# Patient Record
Sex: Male | Born: 1980 | Race: Black or African American | Hispanic: No | Marital: Single | Smoking: Never smoker
Health system: Southern US, Community
[De-identification: ages and names within clinical notes are randomized; demographics above are authoritative.]

## PROBLEM LIST (undated history)

## (undated) DIAGNOSIS — T7840XA Allergy, unspecified, initial encounter: Secondary | ICD-10-CM

## (undated) HISTORY — DX: Allergy, unspecified, initial encounter: T78.40XA

---

## 2003-11-19 ENCOUNTER — Emergency Department (HOSPITAL_COMMUNITY): Admission: EM | Admit: 2003-11-19 | Discharge: 2003-11-19 | Payer: Self-pay | Admitting: Emergency Medicine

## 2003-12-10 ENCOUNTER — Emergency Department (HOSPITAL_COMMUNITY): Admission: EM | Admit: 2003-12-10 | Discharge: 2003-12-11 | Payer: Self-pay | Admitting: Emergency Medicine

## 2003-12-11 ENCOUNTER — Ambulatory Visit (HOSPITAL_COMMUNITY): Admission: RE | Admit: 2003-12-11 | Discharge: 2003-12-11 | Payer: Self-pay | Admitting: Emergency Medicine

## 2004-10-05 ENCOUNTER — Emergency Department (HOSPITAL_COMMUNITY): Admission: EM | Admit: 2004-10-05 | Discharge: 2004-10-05 | Payer: Self-pay | Admitting: *Deleted

## 2004-12-06 ENCOUNTER — Emergency Department (HOSPITAL_COMMUNITY): Admission: EM | Admit: 2004-12-06 | Discharge: 2004-12-06 | Payer: Self-pay | Admitting: Family Medicine

## 2010-03-29 ENCOUNTER — Emergency Department (HOSPITAL_COMMUNITY): Admission: EM | Admit: 2010-03-29 | Discharge: 2009-08-05 | Payer: Self-pay | Admitting: Emergency Medicine

## 2016-12-16 DIAGNOSIS — M542 Cervicalgia: Secondary | ICD-10-CM | POA: Diagnosis not present

## 2016-12-16 DIAGNOSIS — M9901 Segmental and somatic dysfunction of cervical region: Secondary | ICD-10-CM | POA: Diagnosis not present

## 2016-12-16 DIAGNOSIS — M9902 Segmental and somatic dysfunction of thoracic region: Secondary | ICD-10-CM | POA: Diagnosis not present

## 2016-12-16 DIAGNOSIS — M436 Torticollis: Secondary | ICD-10-CM | POA: Diagnosis not present

## 2016-12-17 DIAGNOSIS — M542 Cervicalgia: Secondary | ICD-10-CM | POA: Diagnosis not present

## 2016-12-17 DIAGNOSIS — M9902 Segmental and somatic dysfunction of thoracic region: Secondary | ICD-10-CM | POA: Diagnosis not present

## 2016-12-17 DIAGNOSIS — M436 Torticollis: Secondary | ICD-10-CM | POA: Diagnosis not present

## 2016-12-17 DIAGNOSIS — M9901 Segmental and somatic dysfunction of cervical region: Secondary | ICD-10-CM | POA: Diagnosis not present

## 2017-10-02 ENCOUNTER — Other Ambulatory Visit: Payer: Self-pay

## 2017-10-02 ENCOUNTER — Emergency Department (HOSPITAL_COMMUNITY)
Admission: EM | Admit: 2017-10-02 | Discharge: 2017-10-02 | Disposition: A | Discharge status: Home / Self Care | Payer: BLUE CROSS/BLUE SHIELD | Attending: Emergency Medicine | Admitting: Emergency Medicine

## 2017-10-02 ENCOUNTER — Encounter (HOSPITAL_COMMUNITY): Payer: Self-pay | Admitting: Emergency Medicine

## 2017-10-02 DIAGNOSIS — S71112A Laceration without foreign body, left thigh, initial encounter: Secondary | ICD-10-CM | POA: Insufficient documentation

## 2017-10-02 DIAGNOSIS — Y999 Unspecified external cause status: Secondary | ICD-10-CM | POA: Diagnosis not present

## 2017-10-02 DIAGNOSIS — Y929 Unspecified place or not applicable: Secondary | ICD-10-CM | POA: Diagnosis not present

## 2017-10-02 DIAGNOSIS — Z23 Encounter for immunization: Secondary | ICD-10-CM | POA: Insufficient documentation

## 2017-10-02 DIAGNOSIS — W268XXA Contact with other sharp object(s), not elsewhere classified, initial encounter: Secondary | ICD-10-CM | POA: Insufficient documentation

## 2017-10-02 DIAGNOSIS — S71111A Laceration without foreign body, right thigh, initial encounter: Secondary | ICD-10-CM

## 2017-10-02 DIAGNOSIS — Y93E8 Activity, other personal hygiene: Secondary | ICD-10-CM | POA: Insufficient documentation

## 2017-10-02 MED ORDER — CEPHALEXIN 500 MG PO CAPS: 500.0000 mg | ORAL_CAPSULE | Freq: Two times a day (BID) | ORAL | 0 refills | Status: DC

## 2017-10-02 MED ORDER — TRAMADOL HCL 50 MG PO TABS: 50.0000 mg | ORAL_TABLET | Freq: Four times a day (QID) | ORAL | 0 refills | Status: DC | PRN

## 2017-10-02 MED ORDER — LIDOCAINE-EPINEPHRINE 2 %-1:100000 IJ SOLN
20.0000 mL | Freq: Once | INTRAMUSCULAR | Status: AC
  Administered 2017-10-02: 20 mL
  Filled 2017-10-02 (×2): qty 20

## 2017-10-02 MED ORDER — TETANUS-DIPHTH-ACELL PERTUSSIS 5-2.5-18.5 LF-MCG/0.5 IM SUSP
0.5000 mL | Freq: Once | INTRAMUSCULAR | Status: AC
  Administered 2017-10-02: 0.5 mL via INTRAMUSCULAR
  Filled 2017-10-02: qty 0.5

## 2017-10-02 MED ORDER — CEPHALEXIN 250 MG PO CAPS
500.0000 mg | ORAL_CAPSULE | Freq: Once | ORAL | Status: AC
  Administered 2017-10-02: 500 mg via ORAL
  Filled 2017-10-02: qty 2

## 2017-10-02 NOTE — ED Triage Notes (Signed)
Pt cut his right thigh with a razor blade while on the job. Scant amount of bleeding noted to wound. Unknown last tetanus.

## 2017-10-02 NOTE — ED Provider Notes (Signed)
[facility] Provider Note   CSN: [id number] Arrival date & time: 10/02/17  1957   History   Chief Complaint Chief Complaint  Patient presents with  . Laceration    HPI Rigoberto NoelJason J Caughlin is a 37 y.o. male.  HPI   [age] YOM presents after lacerating his right thigh.  Patient notes he was using a razor cut through the anterior aspect of the thigh.  He denies any loss of distal sensation strength and motor function full extension at the knee.  Bleeding controlled with direct compression.  Unknown last tetanus shot.  History reviewed. No pertinent past medical history.  There are no active problems to display for this patient.        Home Medications    Prior to Admission medications   Medication Sig Start Date End Date Taking? Authorizing Provider  cephALEXin (KEFLEX) 500 MG capsule Take 1 capsule (500 mg total) by mouth 2 (two) times daily. 10/02/17   Elnora Quizon, Tinnie GensJeffrey, PA-C  traMADol (ULTRAM) 50 MG tablet Take 1 tablet (50 mg total) by mouth every 6 (six) hours as needed. 10/02/17   Eyvonne MechanicHedges, [employer]i [employer], PA-C    Family History No family history on file.  Social History Social History   Tobacco Use  . Smoking status: Not on file  Substance Use Topics  . Alcohol use: Not on file  . Drug use: Not on file     Allergies   Patient has no known allergies.   Review of Systems Review of Systems  All other systems reviewed and are negative.    Physical Exam Updated Vital Signs BP 103/75   Pulse 81   Temp 98.8 F (37.1 C) (Oral)   Resp 16   Ht 6' (1.829 m)   Wt 108.9 kg (240 lb)   SpO2 99%   BMI 32.55 kg/m   Physical Exam  Constitutional: He is oriented to person, place, and time. He appears well-developed and well-nourished.  HENT:  Head: Normocephalic and atraumatic.  Eyes: Pupils are equal, round, and reactive to light. Conjunctivae are normal. Right eye exhibits no discharge. Left eye exhibits no discharge. No scleral  icterus.  Neck: Normal range of motion. No JVD present. No tracheal deviation present.  Pulmonary/Chest: Effort normal. No stridor.  Musculoskeletal:  7 cm laceration right anterior distal thigh -no muscular or tendon involvement distal sensation intact, strength intact with full extension at the knee  Neurological: He is alert and oriented to person, place, and time. Coordination normal.  Psychiatric: He has a normal mood and affect. His behavior is normal. Judgment and thought content normal.  Nursing note and vitals reviewed.    ED Treatments / Results  Labs (all labs ordered are listed, but only abnormal results are displayed) Labs Reviewed - No data to display  EKG None  Radiology No results found.  Procedures .Marland Kitchen.Laceration Repair Date/Time: 10/02/2017 9:48 PM Performed by: Eyvonne MechanicHedges, Genesee Nase, PA-C Authorized by: Eyvonne MechanicHedges, Haris Baack, PA-C   Consent:    Consent obtained:  Verbal   Consent given by:  Patient   Risks discussed:  Pain and poor wound healing Anesthesia (see MAR for exact dosages):    Anesthesia method:  Local infiltration   Local anesthetic:  Lidocaine 2% WITH epi Laceration details:    Location: right thigh.   Length (cm):  7 Repair type:    Repair type:  Simple Pre-procedure details:    Preparation:  Patient was prepped and draped in usual sterile fashion Exploration:  Wound exploration: wound explored through full range of motion and entire depth of wound probed and visualized     Wound extent: no fascia violation noted, no foreign bodies/material noted, no muscle damage noted, no nerve damage noted, no tendon damage noted, no underlying fracture noted and no vascular damage noted     Contaminated: no   Treatment:    Area cleansed with:  Betadine and saline   Amount of cleaning:  Extensive   Irrigation solution:  Sterile saline   Irrigation volume:  1   Irrigation method:  Syringe Skin repair:    Repair method:  Sutures   Suture size:  3-0   Suture  material:  Prolene   Suture technique:  Simple interrupted   Number of sutures:  7 Approximation:    Approximation:  Close Post-procedure details:    Dressing:  Antibiotic ointment and non-adherent dressing Comments:     Additional 3 subcutaneous Vicryl rapides placed    (including critical care time)  Medications Ordered in ED Medications  lidocaine-EPINEPHrine (XYLOCAINE W/EPI) 2 %-1:100000 (with pres) injection 20 mL (20 mLs Infiltration Given 10/02/17 2041)  Tdap (BOOSTRIX) injection 0.5 mL (0.5 mLs Intramuscular Given 10/02/17 2125)  cephALEXin (KEFLEX) capsule 500 mg (500 mg Oral Given 10/02/17 2206)     Initial Impression / Assessment and Plan / ED Course  I have reviewed the triage vital signs and the nursing notes.  Pertinent labs & imaging results that were available during my care of the patient were reviewed by me and considered in my medical decision making (see chart for details).     Labs:   Imaging:  Consults:  Therapeutics: TDAP  Discharge Meds:   Assessment/Plan: 37 year old male presents today with laceration.  Wound copiously irrigated.  This was repaired without complication.  Patient placed on prophylactic antibiotics, he is given strict return precautions, wound care instructions and suture removal instructions.  Patient verbalized understanding and agreement to today's plan had no further questions or concerns at the time discharge.   Final Clinical Impressions(s) / ED Diagnoses   Final diagnoses:  Laceration of right thigh, initial encounter    ED Discharge Orders        Ordered    cephALEXin (KEFLEX) 500 MG capsule  2 times daily     10/02/17 2152    traMADol (ULTRAM) 50 MG tablet  Every 6 hours PRN     10/02/17 2205       Eyvonne Mechanic, PA-C 10/02/17 2220    Margarita Grizzle, MD 10/11/17 1625

## 2017-10-02 NOTE — Discharge Instructions (Addendum)
Please read attached information. If you experience any new or worsening signs or symptoms please return to the emergency room for evaluation. Please follow-up with your primary care provider or specialist as discussed. Please use medication prescribed only as directed and discontinue taking if you have any concerning signs or symptoms.   °

## 2017-12-03 DIAGNOSIS — J069 Acute upper respiratory infection, unspecified: Secondary | ICD-10-CM | POA: Diagnosis not present

## 2017-12-03 DIAGNOSIS — K120 Recurrent oral aphthae: Secondary | ICD-10-CM | POA: Diagnosis not present

## 2018-03-25 DIAGNOSIS — R0982 Postnasal drip: Secondary | ICD-10-CM | POA: Diagnosis not present

## 2018-03-25 DIAGNOSIS — K120 Recurrent oral aphthae: Secondary | ICD-10-CM | POA: Diagnosis not present

## 2018-03-25 DIAGNOSIS — R0981 Nasal congestion: Secondary | ICD-10-CM | POA: Diagnosis not present

## 2018-04-03 ENCOUNTER — Ambulatory Visit
Admission: RE | Admit: 2018-04-03 | Discharge: 2018-04-03 | Disposition: A | Discharge status: Home / Self Care | Payer: BLUE CROSS/BLUE SHIELD | Source: Ambulatory Visit | Attending: Family Medicine | Admitting: Family Medicine

## 2018-04-03 ENCOUNTER — Other Ambulatory Visit: Payer: Self-pay | Admitting: Family Medicine

## 2018-04-03 DIAGNOSIS — R51 Headache: Secondary | ICD-10-CM | POA: Diagnosis not present

## 2018-04-03 DIAGNOSIS — R0981 Nasal congestion: Secondary | ICD-10-CM

## 2018-05-07 DIAGNOSIS — M25512 Pain in left shoulder: Secondary | ICD-10-CM | POA: Diagnosis not present

## 2018-05-07 DIAGNOSIS — M7542 Impingement syndrome of left shoulder: Secondary | ICD-10-CM | POA: Diagnosis not present

## 2018-05-19 ENCOUNTER — Ambulatory Visit: Payer: BLUE CROSS/BLUE SHIELD | Admitting: Allergy & Immunology

## 2018-05-19 ENCOUNTER — Encounter: Payer: Self-pay | Admitting: Allergy & Immunology

## 2018-05-19 VITALS — BP 110/66 | HR 66 | Temp 98.6°F | Resp 16 | Ht 72.0 in | Wt 240.6 lb

## 2018-05-19 DIAGNOSIS — J302 Other seasonal allergic rhinitis: Secondary | ICD-10-CM | POA: Diagnosis not present

## 2018-05-19 DIAGNOSIS — E739 Lactose intolerance, unspecified: Secondary | ICD-10-CM | POA: Insufficient documentation

## 2018-05-19 DIAGNOSIS — T781XXD Other adverse food reactions, not elsewhere classified, subsequent encounter: Secondary | ICD-10-CM

## 2018-05-19 DIAGNOSIS — J3089 Other allergic rhinitis: Secondary | ICD-10-CM | POA: Diagnosis not present

## 2018-05-19 MED ORDER — EPINEPHRINE 0.3 MG/0.3ML IJ SOAJ: 0.3000 mg | Freq: Once | INTRAMUSCULAR | 2 refills | Status: AC

## 2018-05-19 MED ORDER — FLUTICASONE PROPIONATE 93 MCG/ACT NA EXHU: 2.0000 | INHALANT_SUSPENSION | Freq: Two times a day (BID) | NASAL | 5 refills | Status: DC

## 2018-05-19 NOTE — Progress Notes (Signed)
NEW PATIENT  Date of Service/Encounter:  05/19/18  Referring provider: Darrow Bussing, MD   Assessment:   Seasonal and perennial allergic rhinitis (ragweed, trees, grasses, indoor molds, outdoor molds, cat and cockroach)  Adverse food reaction (dairy) - with negative testing today, likely lactose intolerance  Plan/Recommendations:   1. Seasonal and perennial allergic rhinitis - Testing today showed: ragweed, trees, grasses, indoor molds, outdoor molds, cat and cockroach - Copy of test results provided.  - Avoidance measures provided. - Wean: Allegra-D (change to three times weekly for 2-3 months and then STOP it) - Stop taking: Flonase - Continue with: Astelin (azelastine) 2 sprays per nostril 1-2 times daily as needed - Start taking: Allegra (fexofenadine) 180mg  table once daily and Xhance (fluticasone) 1-2 sprays per nostril twice daily  - We will send in the script to the [facility] outpatient pharmacy, and they will call you to confirm your shipping address. - You can review how to use the device here: https://www.xhance.com - Ask to be enrolled in the auto-refill program so you can get a year for free. - You can use an extra dose of the antihistamine, if needed, for breakthrough symptoms.  - Consider nasal saline rinses 1-2 times daily to remove allergens from the nasal cavities as well as help with mucous clearance (this is especially helpful to do before the nasal sprays are given) - Consider allergy shots as a means of long-term control. - Allergy shots "re-train" and "reset" the immune system to ignore environmental allergens and decrease the resulting immune response to those allergens (sneezing, itchy watery eyes, runny nose, nasal congestion, etc).    - Allergy shots improve symptoms in 75-85% of patients.  - We can discuss more at the next appointment if the medications are not working for you.  2. Adverse food reaction - Testing was negative to cow's milk and  casein. - Try using Lactaid tablets before ingesting dairy to see if this helps.  3. Return in about 3 months (around 08/18/2018).  Subjective:   Preston Simmons is a 38 y.o. male presenting today for evaluation of  Chief Complaint  Patient presents with  . Allergic Rhinitis     Preston Simmons has a history of the following: Patient Active Problem List   Diagnosis Date Noted  . Seasonal and perennial allergic rhinitis 05/19/2018  . Lactose intolerance 05/19/2018    History obtained from: chart review and patient.  Preston Simmons was referred by Darrow Bussing, MD.     Preston Simmons is a 38 y.o. male presenting for an evaluation of chronic rhinitis.     Allergic Rhinitis Symptom History: He reports that he has sinus problems every two weeks on average. Typically the symptoms start as a URI and nasal drainage issue. Then he will start to have some stuffiness and ear fullness. As a result of this, it leads to canker sores in his mouth. Symptoms have been going on for ten years but over the last three years symptoms have been worse and more frequent. He uses Flonase every day as well as Allegra D. He was recently started on Astelin nasal spray which seems to help, although he does not like the taste of this. He has been on the Allegra D every day, but he has been using the Flonase which seems to work better than the Allegra-D. He has not tried switching from Allegra-D and he estimates that he has been on the decongestant containing antihistamine for several years at this point. He  does not use Afrin at all. He has tried Claritin without improvement. He tells me that he needs antibiotics around three times in the last two months. He does note that [place] he will start feeling bad and then feel sick for an entire week after this exposure.   Food Allergy Symptom History: He reports some problems with dairy. When he eats anything containing ice cream or cheese,  he will get gassy. He has changed his diet such that he avoids it. He cannot eat large amounts of cheese. Symptoms occur right away. He denies itching, stomach pain, but he does have gassiness. He is fine with baked milk. He has tried Lactaid at all.   He did have pneumonia when he was 13 or 14 when he had a lymph node removed. He had pneumonia when he was in the hospital following this procedure. 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 He did go to see an oncologist for a period of time, but he never received treatment for lymphoma.   Otherwise, there is no history of other atopic diseases, including asthma, drug allergies, stinging insect allergies, eczema or urticaria. There is no significant infectious history. Vaccinations are up to date.    Past Medical History: Patient Active Problem List   Diagnosis Date Noted  . Seasonal and perennial allergic rhinitis 05/19/2018  . Lactose intolerance 05/19/2018    Medication List:  Allergies as of 05/19/2018   No Known Allergies     Medication List       Accurate as of May 19, 2018 11:56 AM. Always use your most recent med list.        azelastine 0.1 % nasal spray Commonly known as:  ASTELIN   EPINEPHrine 0.3 mg/0.3 mL Soaj injection Commonly known as:  AUVI-Q Inject 0.3 mLs (0.3 mg total) into the muscle once for 1 dose. As directed for life-threatening allergic reactions   fexofenadine-pseudoephedrine 180-240 MG 24 hr tablet Commonly known as:  ALLEGRA-D 24 Take 1 tablet by mouth daily.   Fluticasone Propionate 93 MCG/ACT Exhu Commonly known as:  XHANCE Place 2 sprays into the nose 2 (two) times daily.   traMADol 50 MG tablet Commonly known as:  ULTRAM Take 1 tablet (50 mg total) by mouth every 6 (six) hours as needed.   triamcinolone 0.1 % paste Commonly known as:  KENALOG       Birth History: non-contributory  Developmental History: non-contributory.   Past Surgical History: History  reviewed. No pertinent surgical history.   Family History: History reviewed. No pertinent family history.   Social History: Preston Simmons lives at home with his girlfriend as well as his biologic daughter (age 37) and his step daughter (age 299).  He lives in a house that is 37 years old.  There is wood and carpeting throughout the home.  They have gas heating and central cooling.  There is 1 dog inside of the home.  There are dust mite covers on the bed, but not the pillows.  There is no tobacco exposure.  He did smoke in the past but has since quit.  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 He works at [employer].     Review of Systems: a 14-point review of systems is pertinent for what is mentioned in HPI.  Otherwise, all other systems were negative. Constitutional: negative other than that listed in the HPI Eyes: negative other  than that listed in the HPI Ears, nose, mouth, throat, and face: negative other than that listed in the HPI Respiratory: negative other than that listed in the HPI Cardiovascular: negative other than that listed in the HPI Gastrointestinal: negative other than that listed in the HPI Genitourinary: negative other than that listed in the HPI Integument: negative other than that listed in the HPI Hematologic: negative other than that listed in the HPI Musculoskeletal: negative other than that listed in the HPI Neurological: negative other than that listed in the HPI Allergy/Immunologic: negative other than that listed in the HPI    Objective:   Blood pressure 110/66, pulse 66, temperature 98.6 F (37 C), temperature source Oral, resp. rate 16, height 6' (1.829 m), weight 240 lb 9.6 oz (109.1 kg), SpO2 97 %. Body mass index is 32.63 kg/m.   Physical Exam:  General: Alert, interactive, in no acute distress. Pleasant male. Very talkative.  Eyes: No conjunctival injection bilaterally, no discharge on the right, no discharge on the left and no  Horner-Trantas dots present. PERRL bilaterally. EOMI without pain. No photophobia.  Ears: Right TM pearly gray with normal light reflex, Left TM pearly gray with normal light reflex, Right TM intact without perforation and Left TM intact without perforation.  Nose/Throat: External nose within normal limits and septum midline. Turbinates edematous and pale with clear discharge. Posterior oropharynx markedly erythematous with cobblestoning in the posterior oropharynx. Tonsils 2+ without exudates.  Tongue without thrush. Neck: Supple without thyromegaly. Trachea midline. Adenopathy: no enlarged lymph nodes appreciated in the anterior cervical, occipital, axillary, epitrochlear, inguinal, or popliteal regions. Lungs: Clear to auscultation without wheezing, rhonchi or rales. No increased work of breathing. CV: Normal S1/S2. No murmurs. Capillary refill <2 seconds.  Abdomen: Nondistended, nontender. No guarding or rebound tenderness. Bowel sounds present in all fields and hypoactive  Skin: Warm and dry, without lesions or rashes. Extremities:  No clubbing, cyanosis or edema. Neuro:   Grossly intact. No focal deficits appreciated. Responsive to questions.  Diagnostic studies:    Allergy Studies:   Airborne Adult Perc - 05/19/18 0932    Time Antigen Placed  0935    Allergen Manufacturer  Waynette ButteryGreer    Location  Back    Number of Test  59    Panel 1  Select    1. Control-Buffer 50% Glycerol  Negative    2. Control-Histamine 1 mg/ml  2+    3. Albumin saline  Negative    4. Bahia  Negative    5. French Southern Territ[place]  Negative    6. Johnson  Negative    7. Kentucky Blue  Negative    8. Meadow Fescue  Negative    9. Perennial Rye  Negative    10. Sweet Vernal  Negative    11. Timothy  Negative    12. Cocklebur  Negative    13. Burweed Marshelder  Negative    14. Ragweed, short  Negative    15. Ragweed, Giant  Negative    16. Plantain,  English  Negative    17. Lamb's Quarters  Negative    18. Sheep Sorrell   Negative    19. Rough Pigweed  Negative    20. Marsh Elder, Rough  Negative    21. Mugwort, Common  Negative    22. Ash mix  Negative    23. Birch mix  Negative    24. Beech American  Negative    25. Box, Elder  Negative    26. Cyd Silenceedar,  red  Negative    27. Cottonwood, Guinea-Bissau  Negative    28. Elm mix  Negative    29. Hickory mix  Negative    30. Maple mix  Negative    31. Oak, Guinea-Bissau mix  Negative    32. Pecan Pollen  Negative    33. Pine mix  Negative    34. Sycamore Eastern  Negative    35. Walnut, Black Pollen  Negative    36. Alternaria alternata  Negative    37. Cladosporium Herbarum  Negative    38. Aspergillus mix  Negative    39. Penicillium mix  Negative    40. Bipolaris sorokiniana (Helminthosporium)  Negative    41. Drechslera spicifera (Curvularia)  Negative    42. Mucor plumbeus  Negative    43. Fusarium moniliforme  Negative    44. Aureobasidium pullulans (pullulara)  Negative    45. Rhizopus oryzae  Negative    46. Botrytis cinera  Negative    47. Epicoccum nigrum  Negative    48. Phoma betae  Negative    49. Candida Albicans  Negative    50. Trichophyton mentagrophytes  Negative    51. Mite, D Farinae  5,000 AU/ml  Negative    52. Mite, D Pteronyssinus  5,000 AU/ml  Negative    53. Cat Hair 10,000 BAU/ml  Negative    54.  Dog Epithelia  Negative    55. Mixed Feathers  Negative    56. Horse Epithelia  Negative    57. Cockroach, German  Negative    58. Mouse  Negative    59. Tobacco Leaf  Negative     Intradermal - 05/19/18 1003    Time Antigen Placed  1005    Allergen Manufacturer  Greer    Location  Arm    Number of Test  15    Intradermal  Select    Control  Negative    French Southern Territ[place]  1+    Johnson  2+    7 Grass  Negative    Ragweed mix  1+    Weed mix  Negative    Tree mix  1+    Mold 1  1+    Mold 2  2+    Mold 3  2+    Mold 4  Negative    Cat  3+    Dog  Negative    Cockroach  2+    Mite mix  Negative     Food Adult Perc - 05/19/18 0900     Time Antigen Placed  1224    Allergen Manufacturer  Waynette Buttery    Location  Back    Number of allergen test  3    Panel 2  Select    Control-Histamine 1 mg/ml  2+    5. Milk, cow  Negative    7. Casein  Negative        Allergy testing results were read and interpreted by myself, documented by clinical staff.       Malachi Bonds, MD [facility]

## 2018-05-19 NOTE — Patient Instructions (Addendum)
1. Seasonal and perennial allergic rhinitis - Testing today showed: ragweed, trees, grasses, indoor molds, outdoor molds, cat and cockroach - Copy of test results provided.  - Avoidance measures provided. - Wean: Allegra-D (change to three times weekly for 2-3 months and then STOP it) - Stop taking: Flonase - Continue with: Astelin (azelastine) 2 sprays per nostril 1-2 times daily as needed - Start taking: Allegra (fexofenadine) 180mg  table once daily and Xhance (fluticasone) 1-2 sprays per nostril twice daily  - [facility], and they will call you to confirm your shipping address. - You can review how to use the device here: https://www.xhance.com - Ask to be enrolled in the auto-refill program so you can get a year for free. - You can use an extra dose of the antihistamine, if needed, for breakthrough symptoms.  - Consider nasal saline rinses 1-2 times daily to remove allergens from the nasal cavities as well as help with mucous clearance (this is especially helpful to do before the nasal sprays are given) - Consider allergy shots as a means of long-term control. - Allergy shots "re-train" and "reset" the immune system to ignore environmental allergens and decrease the resulting immune response to those allergens (sneezing, itchy watery eyes, runny nose, nasal congestion, etc).    - Allergy shots improve symptoms in 75-85% of patients.  - We can discuss more at the next appointment if the medications are not working for you.  2. Adverse food reaction - Testing was negative to cow's milk and casein. - Try using Lactaid tablets before ingesting dairy to see if this helps.  3. Return in about 3 months (around 08/18/2018).   Please inform us of any Emergency Department visits, hospitalizations, or changes in symptoms. Call us before going to the ED for breathing or allergy symptoms since we might be able to fit you in for a sick visit. Feel free  to contact us anytime with any questions, problems, or concerns.  It was a pleasure to meet you today!  Websites that have reliable patient information: 1. American Academy of Asthma, Allergy, and Immunology: www.aaaai.org 2. Food Allergy Research and Education (FARE): foodallergy.org 3. Mothers of Asthmatics: http://www.asthmacommunitynetwork.org 4. American College of Allergy, Asthma, and Immunology: MissingWeapons.cawww.acaai.org   Make sure you are registered to vote! If you have moved or changed any of your contact information, you will need to get this updated before voting!    Voter ID laws are going into effect for the General Election in November 2020! Be prepared! Check out LandscapingDigest.dkhttps://www.ncsbe.gov/voter-ID for more details.      Reducing Pollen Exposure  The American Academy of Allergy, Asthma and Immunology suggests the following steps to reduce your exposure to pollen during allergy seasons.    1. Do not hang sheets or clothing out to dry; pollen may collect on these items. 2. Do not mow lawns or spend time around freshly cut grass; mowing stirs up pollen. 3. Keep windows closed at night.  Keep car windows closed while driving. 4. Minimize morning activities outdoors, a time when pollen counts are usually at their highest. 5. Stay indoors as much as possible when pollen counts or humidity is high and on windy days when pollen tends to remain in the air longer. 6. Use air conditioning when possible.  Many air conditioners have filters that trap the pollen spores. 7. Use a HEPA room air filter to remove pollen form the indoor air you breathe.  Control of  Mold Allergen   Mold and fungi can grow on a variety of surfaces provided certain temperature and moisture conditions exist.  Outdoor molds grow on plants, decaying vegetation and soil.  The major outdoor mold, Alternaria and Cladosporium, are found in very high numbers during hot and dry conditions.  Generally, a late Summer - Fall peak is  seen for common outdoor fungal spores.  Rain will temporarily lower outdoor mold spore count, but counts rise rapidly when the rainy period ends.  The most important indoor molds are Aspergillus and Penicillium.  Dark, humid and poorly ventilated basements are ideal sites for mold growth.  The next most common sites of mold growth are the bathroom and the kitchen.  Outdoor (Seasonal) Mold Control  Positive outdoor molds via skin testing: Alternaria, Cladosporium, Bipolaris (Helminthsporium), Drechslera (Curvalaria) and Mucor  1. Use air conditioning and keep windows closed 2. Avoid exposure to decaying vegetation. 3. Avoid leaf raking. 4. Avoid grain handling. 5. Consider wearing a face mask if working in moldy areas.  6.   Indoor (Perennial) Mold Control   Positive indoor molds via skin testing: Aspergillus and Penicillium  1. Maintain humidity below 50%. 2. Clean washable surfaces with 5% bleach solution. 3. Remove sources e.g. contaminated carpets.     Control of Dog or Cat Allergen  Avoidance is the best way to manage a dog or cat allergy. If you have a dog or cat and are allergic to dog or cats, consider removing the dog or cat from the home. If you have a dog or cat but don't want to find it a new home, or if your family wants a pet even though someone in the household is allergic, here are some strategies that may help keep symptoms at bay:  1. Keep the pet out of your bedroom and restrict it to only a few rooms. Be advised that keeping the dog or cat in only one room will not limit the allergens to that room. 2. Don't pet, hug or kiss the dog or cat; if you do, wash your hands with soap and water. 3. High-efficiency particulate air (HEPA) cleaners run continuously in a bedroom or living room can reduce allergen levels over time. 4. Regular use of a high-efficiency vacuum cleaner or a central vacuum can reduce allergen levels. 5. Giving your dog or cat a bath at least once a  week can reduce airborne allergen.  Control of Cockroach Allergen  Cockroach allergen has been identified as an important cause of acute attacks of asthma, especially in urban settings.  There are fifty-five species of cockroach that exist in the [place], however only three, the [place], [place] and Oriental species produce allergen that can affect patients with Asthma.  Allergens can be obtained from fecal particles, egg casings and secretions from cockroaches.    1. Remove food sources. 2. Reduce access to water. 3. Seal access and entry points. 4. Spray runways with 0.5-1% Diazinon or Chlorpyrifos 5. Blow boric acid power under stoves and refrigerator. 6. Place bait stations (hydramethylnon) at feeding sites.  Allergy Shots   Allergies are the result of a chain reaction that starts in the immune system. Your immune system controls how your body defends itself. For instance, if you have an allergy to pollen, your immune system identifies pollen as an invader or allergen. Your immune system overreacts by producing antibodies called Immunoglobulin E (IgE). These antibodies travel to cells that release chemicals, causing an allergic reaction.  The concept behind allergy  immunotherapy, whether it is received in the form of shots or tablets, is that the immune system can be desensitized to specific allergens that trigger allergy symptoms. Although it requires time and patience, the payback can be long-term relief.  How Do Allergy Shots Work?  Allergy shots work much like a vaccine. Your body responds to injected amounts of a particular allergen given in increasing doses, eventually developing a resistance and tolerance to it. Allergy shots can lead to decreased, minimal or no allergy symptoms.  There generally are two phases: build-up and maintenance. Build-up often ranges from three to six months and involves receiving injections with increasing amounts of the allergens. The shots are  typically given once or twice a week, though more rapid build-up schedules are sometimes used.  The maintenance phase begins when the most effective dose is reached. This dose is different for each person, depending on how allergic you are and your response to the build-up injections. Once the maintenance dose is reached, there are longer periods between injections, typically two to four weeks.  Occasionally doctors give cortisone-type shots that can temporarily reduce allergy symptoms. These types of shots are different and should not be confused with allergy immunotherapy shots.  Who Can Be Treated with Allergy Shots?  Allergy shots may be a good treatment approach for people with allergic rhinitis (hay fever), allergic asthma, conjunctivitis (eye allergy) or stinging insect allergy.   Before deciding to begin allergy shots, you should consider:  . The length of allergy season and the severity of your symptoms . Whether medications and/or changes to your environment can control your symptoms . Your desire to avoid long-term medication use . Time: allergy immunotherapy requires a major time commitment . Cost: may vary depending on your insurance coverage  Allergy shots for children age 23 and older are effective and often well tolerated. They might prevent the onset of new allergen sensitivities or the progression to asthma.  Allergy shots are not started on patients who are pregnant but can be continued on patients who become pregnant while receiving them. In some patients with other medical conditions or who take certain common medications, allergy shots may be of risk. It is important to mention other medications you talk to your allergist.   When Will I Feel Better?  Some may experience decreased allergy symptoms during the build-up phase. For others, it may take as long as 12 months on the maintenance dose. If there is no improvement after a year of maintenance, your allergist will  discuss other treatment options with you.  If you aren't responding to allergy shots, it may be because there is not enough dose of the allergen in your vaccine or there are missing allergens that were not identified during your allergy testing. Other reasons could be that there are high levels of the allergen in your environment or major exposure to non-allergic triggers like tobacco smoke.  What Is the Length of Treatment?  Once the maintenance dose is reached, allergy shots are generally continued for three to five years. The decision to stop should be discussed with your allergist at that time. Some people may experience a permanent reduction of allergy symptoms. Others may relapse and a longer course of allergy shots can be considered.  What Are the Possible Reactions?  The two types of adverse reactions that can occur with allergy shots are local and systemic. Common local reactions include very mild redness and swelling at the injection site, which can happen immediately or several  hours after. A systemic reaction, which is less common, affects the entire body or a particular body system. They are usually mild and typically respond quickly to medications. Signs include increased allergy symptoms such as sneezing, a stuffy nose or hives.  Rarely, a serious systemic reaction called anaphylaxis can develop. Symptoms include swelling in the throat, wheezing, a feeling of tightness in the chest, nausea or dizziness. Most serious systemic reactions develop within 30 minutes of allergy shots. This is why it is strongly recommended you wait in your doctor's office for 30 minutes after your injections. Your allergist is trained to watch for reactions, and his or her staff is trained and equipped with the proper medications to identify and treat them.  Who Should Administer Allergy Shots?  The preferred location for receiving shots is your prescribing allergist's office. Injections can sometimes be  given at another facility where the physician and staff are trained to recognize and treat reactions, and have received instructions by your prescribing allergist.

## 2018-05-20 DIAGNOSIS — J301 Allergic rhinitis due to pollen: Secondary | ICD-10-CM | POA: Diagnosis not present

## 2018-05-20 NOTE — Addendum Note (Signed)
Addended by: Alfonse Spruce on: 05/20/2018 09:47 PM   Modules accepted: Orders

## 2018-05-20 NOTE — Progress Notes (Signed)
We received notification from Denney that he is interested in pursuing allergen immunotherapy. Prescriptions written and routed to the Immunotherapy Team.   Malachi Bonds, MD [facility]

## 2018-05-21 DIAGNOSIS — J3089 Other allergic rhinitis: Secondary | ICD-10-CM | POA: Diagnosis not present

## 2018-05-21 NOTE — Progress Notes (Signed)
VIALS EXP 05-22-2019

## 2018-06-05 ENCOUNTER — Ambulatory Visit (INDEPENDENT_AMBULATORY_CARE_PROVIDER_SITE_OTHER): Payer: BLUE CROSS/BLUE SHIELD | Admitting: *Deleted

## 2018-06-05 DIAGNOSIS — J309 Allergic rhinitis, unspecified: Secondary | ICD-10-CM | POA: Diagnosis not present

## 2018-06-05 MED ORDER — EPINEPHRINE 0.3 MG/0.3ML IJ SOAJ: 0.3000 mg | INTRAMUSCULAR | 1 refills | Status: DC | PRN

## 2018-06-05 NOTE — Progress Notes (Signed)
Immunotherapy   Patient Details  Name: Preston Simmons MRN: [id number] Date of Birth: [birth year]  06/05/2018  Preston Simmons started injections for  Mold-Cockroach & Grass-Tree-Ragweed-Cat. Following schedule: B  Frequency:2 times per week Epi-Pen:Epi-Pen Available  Consent signed and patient instructions given. No problems after 30 minutes in the office. Patient will ask nurse at his job if she would be will to give him the injections and let us know. Patient works at [employer].   Mariane Duval 06/05/2018, 2:21 PM

## 2018-06-09 ENCOUNTER — Ambulatory Visit (INDEPENDENT_AMBULATORY_CARE_PROVIDER_SITE_OTHER): Payer: BLUE CROSS/BLUE SHIELD | Admitting: *Deleted

## 2018-06-09 DIAGNOSIS — J309 Allergic rhinitis, unspecified: Secondary | ICD-10-CM | POA: Diagnosis not present

## 2018-06-16 ENCOUNTER — Ambulatory Visit (INDEPENDENT_AMBULATORY_CARE_PROVIDER_SITE_OTHER): Payer: BLUE CROSS/BLUE SHIELD | Admitting: *Deleted

## 2018-06-16 DIAGNOSIS — J309 Allergic rhinitis, unspecified: Secondary | ICD-10-CM | POA: Diagnosis not present

## 2018-06-18 ENCOUNTER — Ambulatory Visit (INDEPENDENT_AMBULATORY_CARE_PROVIDER_SITE_OTHER): Payer: BLUE CROSS/BLUE SHIELD | Admitting: *Deleted

## 2018-06-18 DIAGNOSIS — J309 Allergic rhinitis, unspecified: Secondary | ICD-10-CM

## 2018-06-23 ENCOUNTER — Ambulatory Visit (INDEPENDENT_AMBULATORY_CARE_PROVIDER_SITE_OTHER): Payer: BLUE CROSS/BLUE SHIELD | Admitting: *Deleted

## 2018-06-23 DIAGNOSIS — J309 Allergic rhinitis, unspecified: Secondary | ICD-10-CM

## 2018-07-07 ENCOUNTER — Ambulatory Visit (INDEPENDENT_AMBULATORY_CARE_PROVIDER_SITE_OTHER): Payer: BLUE CROSS/BLUE SHIELD | Admitting: *Deleted

## 2018-07-07 DIAGNOSIS — J309 Allergic rhinitis, unspecified: Secondary | ICD-10-CM | POA: Diagnosis not present

## 2018-07-08 ENCOUNTER — Emergency Department: Payer: BLUE CROSS/BLUE SHIELD

## 2018-07-08 ENCOUNTER — Other Ambulatory Visit: Payer: Self-pay

## 2018-07-08 ENCOUNTER — Emergency Department
Admission: EM | Admit: 2018-07-08 | Discharge: 2018-07-08 | Disposition: A | Discharge status: Home / Self Care | Payer: BLUE CROSS/BLUE SHIELD | Attending: Emergency Medicine | Admitting: Emergency Medicine

## 2018-07-08 DIAGNOSIS — Z79899 Other long term (current) drug therapy: Secondary | ICD-10-CM | POA: Diagnosis not present

## 2018-07-08 DIAGNOSIS — Y92410 Unspecified street and highway as the place of occurrence of the external cause: Secondary | ICD-10-CM | POA: Diagnosis not present

## 2018-07-08 DIAGNOSIS — Y999 Unspecified external cause status: Secondary | ICD-10-CM | POA: Diagnosis not present

## 2018-07-08 DIAGNOSIS — S39012A Strain of muscle, fascia and tendon of lower back, initial encounter: Secondary | ICD-10-CM | POA: Insufficient documentation

## 2018-07-08 DIAGNOSIS — S3992XA Unspecified injury of lower back, initial encounter: Secondary | ICD-10-CM | POA: Diagnosis not present

## 2018-07-08 DIAGNOSIS — Y939 Activity, unspecified: Secondary | ICD-10-CM | POA: Insufficient documentation

## 2018-07-08 DIAGNOSIS — M545 Low back pain: Secondary | ICD-10-CM | POA: Diagnosis not present

## 2018-07-08 MED ORDER — IBUPROFEN 600 MG PO TABS: 600.0000 mg | ORAL_TABLET | Freq: Three times a day (TID) | ORAL | 0 refills | Status: AC | PRN

## 2018-07-08 MED ORDER — CYCLOBENZAPRINE HCL 10 MG PO TABS: 10.0000 mg | ORAL_TABLET | Freq: Three times a day (TID) | ORAL | 0 refills | Status: DC | PRN

## 2018-07-08 NOTE — ED Notes (Addendum)
First Nurse Note: Patient was in [facility] yesterday, has back pain this AM.  Returned from [place], [place] on 06/29/18.  Was there from 3/5 to 3/9.  Denies any respiratory symptoms or known exposure to [place] virus.
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
## 2018-07-17 ENCOUNTER — Ambulatory Visit (INDEPENDENT_AMBULATORY_CARE_PROVIDER_SITE_OTHER): Payer: BLUE CROSS/BLUE SHIELD | Admitting: *Deleted

## 2018-07-17 DIAGNOSIS — J309 Allergic rhinitis, unspecified: Secondary | ICD-10-CM | POA: Diagnosis not present

## 2018-07-22 ENCOUNTER — Ambulatory Visit (INDEPENDENT_AMBULATORY_CARE_PROVIDER_SITE_OTHER): Payer: BLUE CROSS/BLUE SHIELD

## 2018-07-22 DIAGNOSIS — J309 Allergic rhinitis, unspecified: Secondary | ICD-10-CM | POA: Diagnosis not present

## 2018-07-28 ENCOUNTER — Ambulatory Visit (INDEPENDENT_AMBULATORY_CARE_PROVIDER_SITE_OTHER): Payer: BLUE CROSS/BLUE SHIELD

## 2018-07-28 DIAGNOSIS — J309 Allergic rhinitis, unspecified: Secondary | ICD-10-CM | POA: Diagnosis not present

## 2018-08-13 ENCOUNTER — Ambulatory Visit (INDEPENDENT_AMBULATORY_CARE_PROVIDER_SITE_OTHER): Payer: BLUE CROSS/BLUE SHIELD | Admitting: *Deleted

## 2018-08-13 DIAGNOSIS — J309 Allergic rhinitis, unspecified: Secondary | ICD-10-CM | POA: Diagnosis not present

## 2018-08-20 ENCOUNTER — Ambulatory Visit (INDEPENDENT_AMBULATORY_CARE_PROVIDER_SITE_OTHER): Payer: BLUE CROSS/BLUE SHIELD

## 2018-08-20 DIAGNOSIS — J309 Allergic rhinitis, unspecified: Secondary | ICD-10-CM | POA: Diagnosis not present

## 2018-09-08 ENCOUNTER — Ambulatory Visit (INDEPENDENT_AMBULATORY_CARE_PROVIDER_SITE_OTHER): Payer: BLUE CROSS/BLUE SHIELD

## 2018-09-08 DIAGNOSIS — J309 Allergic rhinitis, unspecified: Secondary | ICD-10-CM

## 2018-09-11 DIAGNOSIS — M24812 Other specific joint derangements of left shoulder, not elsewhere classified: Secondary | ICD-10-CM | POA: Diagnosis not present

## 2018-09-16 ENCOUNTER — Ambulatory Visit (INDEPENDENT_AMBULATORY_CARE_PROVIDER_SITE_OTHER): Payer: BLUE CROSS/BLUE SHIELD | Admitting: *Deleted

## 2018-09-16 DIAGNOSIS — J309 Allergic rhinitis, unspecified: Secondary | ICD-10-CM

## 2018-09-25 DIAGNOSIS — R0981 Nasal congestion: Secondary | ICD-10-CM | POA: Diagnosis not present

## 2018-09-25 DIAGNOSIS — Z0000 Encounter for general adult medical examination without abnormal findings: Secondary | ICD-10-CM | POA: Diagnosis not present

## 2018-10-02 DIAGNOSIS — M24812 Other specific joint derangements of left shoulder, not elsewhere classified: Secondary | ICD-10-CM | POA: Diagnosis not present

## 2018-10-02 DIAGNOSIS — M7582 Other shoulder lesions, left shoulder: Secondary | ICD-10-CM | POA: Diagnosis not present

## 2018-10-02 DIAGNOSIS — M67812 Other specified disorders of synovium, left shoulder: Secondary | ICD-10-CM | POA: Diagnosis not present

## 2018-10-02 DIAGNOSIS — S46012A Strain of muscle(s) and tendon(s) of the rotator cuff of left shoulder, initial encounter: Secondary | ICD-10-CM | POA: Diagnosis not present

## 2018-10-06 ENCOUNTER — Ambulatory Visit (INDEPENDENT_AMBULATORY_CARE_PROVIDER_SITE_OTHER): Payer: BLUE CROSS/BLUE SHIELD | Admitting: *Deleted

## 2018-10-06 DIAGNOSIS — J309 Allergic rhinitis, unspecified: Secondary | ICD-10-CM

## 2018-10-07 DIAGNOSIS — M75122 Complete rotator cuff tear or rupture of left shoulder, not specified as traumatic: Secondary | ICD-10-CM | POA: Diagnosis not present

## 2018-10-10 DIAGNOSIS — M75122 Complete rotator cuff tear or rupture of left shoulder, not specified as traumatic: Secondary | ICD-10-CM | POA: Insufficient documentation

## 2018-10-10 HISTORY — DX: Complete rotator cuff tear or rupture of left shoulder, not specified as traumatic: M75.122

## 2018-10-21 HISTORY — PX: ROTATOR CUFF REPAIR: SHX139

## 2018-10-22 ENCOUNTER — Ambulatory Visit (INDEPENDENT_AMBULATORY_CARE_PROVIDER_SITE_OTHER): Payer: BLUE CROSS/BLUE SHIELD | Admitting: *Deleted

## 2018-10-22 DIAGNOSIS — J309 Allergic rhinitis, unspecified: Secondary | ICD-10-CM | POA: Diagnosis not present

## 2018-11-09 DIAGNOSIS — Z1159 Encounter for screening for other viral diseases: Secondary | ICD-10-CM | POA: Diagnosis not present

## 2018-11-09 DIAGNOSIS — M75122 Complete rotator cuff tear or rupture of left shoulder, not specified as traumatic: Secondary | ICD-10-CM | POA: Diagnosis not present

## 2018-11-09 DIAGNOSIS — M24812 Other specific joint derangements of left shoulder, not elsewhere classified: Secondary | ICD-10-CM | POA: Diagnosis not present

## 2018-11-09 DIAGNOSIS — M7542 Impingement syndrome of left shoulder: Secondary | ICD-10-CM | POA: Diagnosis not present

## 2018-11-09 DIAGNOSIS — Z01812 Encounter for preprocedural laboratory examination: Secondary | ICD-10-CM | POA: Diagnosis not present

## 2018-11-13 DIAGNOSIS — Z8249 Family history of ischemic heart disease and other diseases of the circulatory system: Secondary | ICD-10-CM | POA: Diagnosis not present

## 2018-11-13 DIAGNOSIS — Z6832 Body mass index (BMI) 32.0-32.9, adult: Secondary | ICD-10-CM | POA: Diagnosis not present

## 2018-11-13 DIAGNOSIS — E669 Obesity, unspecified: Secondary | ICD-10-CM | POA: Diagnosis not present

## 2018-11-13 DIAGNOSIS — Z9109 Other allergy status, other than to drugs and biological substances: Secondary | ICD-10-CM | POA: Diagnosis not present

## 2018-11-13 DIAGNOSIS — M25812 Other specified joint disorders, left shoulder: Secondary | ICD-10-CM | POA: Diagnosis not present

## 2018-11-13 DIAGNOSIS — G8918 Other acute postprocedural pain: Secondary | ICD-10-CM | POA: Diagnosis not present

## 2018-11-13 DIAGNOSIS — M7542 Impingement syndrome of left shoulder: Secondary | ICD-10-CM | POA: Diagnosis not present

## 2018-11-13 DIAGNOSIS — Z9889 Other specified postprocedural states: Secondary | ICD-10-CM | POA: Insufficient documentation

## 2018-11-13 DIAGNOSIS — M75112 Incomplete rotator cuff tear or rupture of left shoulder, not specified as traumatic: Secondary | ICD-10-CM | POA: Diagnosis not present

## 2018-11-20 ENCOUNTER — Ambulatory Visit (INDEPENDENT_AMBULATORY_CARE_PROVIDER_SITE_OTHER): Payer: BLUE CROSS/BLUE SHIELD

## 2018-11-20 DIAGNOSIS — M25612 Stiffness of left shoulder, not elsewhere classified: Secondary | ICD-10-CM | POA: Diagnosis not present

## 2018-11-20 DIAGNOSIS — J309 Allergic rhinitis, unspecified: Secondary | ICD-10-CM | POA: Diagnosis not present

## 2018-11-20 DIAGNOSIS — M25512 Pain in left shoulder: Secondary | ICD-10-CM | POA: Diagnosis not present

## 2018-11-20 DIAGNOSIS — M6281 Muscle weakness (generalized): Secondary | ICD-10-CM | POA: Diagnosis not present

## 2018-11-24 DIAGNOSIS — M6281 Muscle weakness (generalized): Secondary | ICD-10-CM | POA: Diagnosis not present

## 2018-11-24 DIAGNOSIS — M25512 Pain in left shoulder: Secondary | ICD-10-CM | POA: Diagnosis not present

## 2018-11-24 DIAGNOSIS — M25612 Stiffness of left shoulder, not elsewhere classified: Secondary | ICD-10-CM | POA: Diagnosis not present

## 2018-11-26 DIAGNOSIS — M6281 Muscle weakness (generalized): Secondary | ICD-10-CM | POA: Diagnosis not present

## 2018-11-26 DIAGNOSIS — M25612 Stiffness of left shoulder, not elsewhere classified: Secondary | ICD-10-CM | POA: Diagnosis not present

## 2018-11-26 DIAGNOSIS — M25512 Pain in left shoulder: Secondary | ICD-10-CM | POA: Diagnosis not present

## 2018-11-30 DIAGNOSIS — M25512 Pain in left shoulder: Secondary | ICD-10-CM | POA: Diagnosis not present

## 2018-11-30 DIAGNOSIS — M25612 Stiffness of left shoulder, not elsewhere classified: Secondary | ICD-10-CM | POA: Diagnosis not present

## 2018-11-30 DIAGNOSIS — M6281 Muscle weakness (generalized): Secondary | ICD-10-CM | POA: Diagnosis not present

## 2018-12-01 ENCOUNTER — Ambulatory Visit (INDEPENDENT_AMBULATORY_CARE_PROVIDER_SITE_OTHER): Payer: BLUE CROSS/BLUE SHIELD | Admitting: *Deleted

## 2018-12-01 DIAGNOSIS — J309 Allergic rhinitis, unspecified: Secondary | ICD-10-CM | POA: Diagnosis not present

## 2018-12-01 DIAGNOSIS — R52 Pain, unspecified: Secondary | ICD-10-CM | POA: Diagnosis not present

## 2018-12-03 DIAGNOSIS — M6281 Muscle weakness (generalized): Secondary | ICD-10-CM | POA: Diagnosis not present

## 2018-12-03 DIAGNOSIS — M25612 Stiffness of left shoulder, not elsewhere classified: Secondary | ICD-10-CM | POA: Diagnosis not present

## 2018-12-03 DIAGNOSIS — M25512 Pain in left shoulder: Secondary | ICD-10-CM | POA: Diagnosis not present

## 2018-12-07 ENCOUNTER — Ambulatory Visit (INDEPENDENT_AMBULATORY_CARE_PROVIDER_SITE_OTHER): Payer: BLUE CROSS/BLUE SHIELD | Admitting: *Deleted

## 2018-12-07 DIAGNOSIS — M6281 Muscle weakness (generalized): Secondary | ICD-10-CM | POA: Diagnosis not present

## 2018-12-07 DIAGNOSIS — M25612 Stiffness of left shoulder, not elsewhere classified: Secondary | ICD-10-CM | POA: Diagnosis not present

## 2018-12-07 DIAGNOSIS — J309 Allergic rhinitis, unspecified: Secondary | ICD-10-CM

## 2018-12-07 DIAGNOSIS — M25512 Pain in left shoulder: Secondary | ICD-10-CM | POA: Diagnosis not present

## 2018-12-10 DIAGNOSIS — M25512 Pain in left shoulder: Secondary | ICD-10-CM | POA: Diagnosis not present

## 2018-12-10 DIAGNOSIS — M6281 Muscle weakness (generalized): Secondary | ICD-10-CM | POA: Diagnosis not present

## 2018-12-10 DIAGNOSIS — M25612 Stiffness of left shoulder, not elsewhere classified: Secondary | ICD-10-CM | POA: Diagnosis not present

## 2018-12-14 DIAGNOSIS — M25512 Pain in left shoulder: Secondary | ICD-10-CM | POA: Diagnosis not present

## 2018-12-14 DIAGNOSIS — M6281 Muscle weakness (generalized): Secondary | ICD-10-CM | POA: Diagnosis not present

## 2018-12-14 DIAGNOSIS — M25612 Stiffness of left shoulder, not elsewhere classified: Secondary | ICD-10-CM | POA: Diagnosis not present

## 2018-12-15 ENCOUNTER — Ambulatory Visit (INDEPENDENT_AMBULATORY_CARE_PROVIDER_SITE_OTHER): Payer: BLUE CROSS/BLUE SHIELD | Admitting: *Deleted

## 2018-12-15 DIAGNOSIS — Z13220 Encounter for screening for lipoid disorders: Secondary | ICD-10-CM | POA: Diagnosis not present

## 2018-12-15 DIAGNOSIS — J309 Allergic rhinitis, unspecified: Secondary | ICD-10-CM

## 2018-12-15 DIAGNOSIS — Z6834 Body mass index (BMI) 34.0-34.9, adult: Secondary | ICD-10-CM | POA: Diagnosis not present

## 2018-12-15 DIAGNOSIS — Z0000 Encounter for general adult medical examination without abnormal findings: Secondary | ICD-10-CM | POA: Diagnosis not present

## 2018-12-17 DIAGNOSIS — M25612 Stiffness of left shoulder, not elsewhere classified: Secondary | ICD-10-CM | POA: Diagnosis not present

## 2018-12-17 DIAGNOSIS — M6281 Muscle weakness (generalized): Secondary | ICD-10-CM | POA: Diagnosis not present

## 2018-12-17 DIAGNOSIS — M25512 Pain in left shoulder: Secondary | ICD-10-CM | POA: Diagnosis not present

## 2018-12-21 DIAGNOSIS — M25612 Stiffness of left shoulder, not elsewhere classified: Secondary | ICD-10-CM | POA: Diagnosis not present

## 2018-12-21 DIAGNOSIS — M6281 Muscle weakness (generalized): Secondary | ICD-10-CM | POA: Diagnosis not present

## 2018-12-21 DIAGNOSIS — M25512 Pain in left shoulder: Secondary | ICD-10-CM | POA: Diagnosis not present

## 2018-12-24 DIAGNOSIS — M25612 Stiffness of left shoulder, not elsewhere classified: Secondary | ICD-10-CM | POA: Diagnosis not present

## 2018-12-24 DIAGNOSIS — M6281 Muscle weakness (generalized): Secondary | ICD-10-CM | POA: Diagnosis not present

## 2018-12-24 DIAGNOSIS — M25512 Pain in left shoulder: Secondary | ICD-10-CM | POA: Diagnosis not present

## 2018-12-29 DIAGNOSIS — M25612 Stiffness of left shoulder, not elsewhere classified: Secondary | ICD-10-CM | POA: Diagnosis not present

## 2018-12-29 DIAGNOSIS — M25512 Pain in left shoulder: Secondary | ICD-10-CM | POA: Diagnosis not present

## 2018-12-29 DIAGNOSIS — M6281 Muscle weakness (generalized): Secondary | ICD-10-CM | POA: Diagnosis not present

## 2019-01-01 DIAGNOSIS — M25512 Pain in left shoulder: Secondary | ICD-10-CM | POA: Diagnosis not present

## 2019-01-01 DIAGNOSIS — M6281 Muscle weakness (generalized): Secondary | ICD-10-CM | POA: Diagnosis not present

## 2019-01-04 DIAGNOSIS — M6281 Muscle weakness (generalized): Secondary | ICD-10-CM | POA: Diagnosis not present

## 2019-01-04 DIAGNOSIS — M25512 Pain in left shoulder: Secondary | ICD-10-CM | POA: Diagnosis not present

## 2019-01-04 DIAGNOSIS — M25612 Stiffness of left shoulder, not elsewhere classified: Secondary | ICD-10-CM | POA: Diagnosis not present

## 2019-01-07 DIAGNOSIS — M25512 Pain in left shoulder: Secondary | ICD-10-CM | POA: Diagnosis not present

## 2019-01-07 DIAGNOSIS — M25612 Stiffness of left shoulder, not elsewhere classified: Secondary | ICD-10-CM | POA: Diagnosis not present

## 2019-01-07 DIAGNOSIS — M6281 Muscle weakness (generalized): Secondary | ICD-10-CM | POA: Diagnosis not present

## 2019-01-11 ENCOUNTER — Other Ambulatory Visit: Payer: Self-pay | Admitting: Allergy & Immunology

## 2019-01-11 DIAGNOSIS — M25512 Pain in left shoulder: Secondary | ICD-10-CM | POA: Diagnosis not present

## 2019-01-11 DIAGNOSIS — M6281 Muscle weakness (generalized): Secondary | ICD-10-CM | POA: Diagnosis not present

## 2019-01-11 DIAGNOSIS — M25612 Stiffness of left shoulder, not elsewhere classified: Secondary | ICD-10-CM | POA: Diagnosis not present

## 2019-01-13 DIAGNOSIS — M6281 Muscle weakness (generalized): Secondary | ICD-10-CM | POA: Diagnosis not present

## 2019-01-13 DIAGNOSIS — M25512 Pain in left shoulder: Secondary | ICD-10-CM | POA: Diagnosis not present

## 2019-01-13 DIAGNOSIS — M25612 Stiffness of left shoulder, not elsewhere classified: Secondary | ICD-10-CM | POA: Diagnosis not present

## 2019-01-18 DIAGNOSIS — M6281 Muscle weakness (generalized): Secondary | ICD-10-CM | POA: Diagnosis not present

## 2019-01-18 DIAGNOSIS — M25612 Stiffness of left shoulder, not elsewhere classified: Secondary | ICD-10-CM | POA: Diagnosis not present

## 2019-01-20 DIAGNOSIS — M25512 Pain in left shoulder: Secondary | ICD-10-CM | POA: Diagnosis not present

## 2019-01-20 DIAGNOSIS — M25612 Stiffness of left shoulder, not elsewhere classified: Secondary | ICD-10-CM | POA: Diagnosis not present

## 2019-01-20 DIAGNOSIS — M6281 Muscle weakness (generalized): Secondary | ICD-10-CM | POA: Diagnosis not present

## 2019-01-26 DIAGNOSIS — M25612 Stiffness of left shoulder, not elsewhere classified: Secondary | ICD-10-CM | POA: Diagnosis not present

## 2019-01-26 DIAGNOSIS — M25512 Pain in left shoulder: Secondary | ICD-10-CM | POA: Diagnosis not present

## 2019-01-26 DIAGNOSIS — M6281 Muscle weakness (generalized): Secondary | ICD-10-CM | POA: Diagnosis not present

## 2019-01-27 DIAGNOSIS — M6281 Muscle weakness (generalized): Secondary | ICD-10-CM | POA: Diagnosis not present

## 2019-01-27 DIAGNOSIS — M25612 Stiffness of left shoulder, not elsewhere classified: Secondary | ICD-10-CM | POA: Diagnosis not present

## 2019-01-27 DIAGNOSIS — M25512 Pain in left shoulder: Secondary | ICD-10-CM | POA: Diagnosis not present

## 2019-02-01 DIAGNOSIS — M6281 Muscle weakness (generalized): Secondary | ICD-10-CM | POA: Diagnosis not present

## 2019-02-01 DIAGNOSIS — M25612 Stiffness of left shoulder, not elsewhere classified: Secondary | ICD-10-CM | POA: Diagnosis not present

## 2019-02-01 DIAGNOSIS — M25512 Pain in left shoulder: Secondary | ICD-10-CM | POA: Diagnosis not present

## 2019-02-03 DIAGNOSIS — M6281 Muscle weakness (generalized): Secondary | ICD-10-CM | POA: Diagnosis not present

## 2019-02-03 DIAGNOSIS — M25512 Pain in left shoulder: Secondary | ICD-10-CM | POA: Diagnosis not present

## 2019-02-03 DIAGNOSIS — M25612 Stiffness of left shoulder, not elsewhere classified: Secondary | ICD-10-CM | POA: Diagnosis not present

## 2019-02-08 DIAGNOSIS — M6281 Muscle weakness (generalized): Secondary | ICD-10-CM | POA: Diagnosis not present

## 2019-02-08 DIAGNOSIS — M25612 Stiffness of left shoulder, not elsewhere classified: Secondary | ICD-10-CM | POA: Diagnosis not present

## 2019-02-08 DIAGNOSIS — M25512 Pain in left shoulder: Secondary | ICD-10-CM | POA: Diagnosis not present

## 2019-02-10 DIAGNOSIS — M25612 Stiffness of left shoulder, not elsewhere classified: Secondary | ICD-10-CM | POA: Diagnosis not present

## 2019-02-10 DIAGNOSIS — M25512 Pain in left shoulder: Secondary | ICD-10-CM | POA: Diagnosis not present

## 2019-02-10 DIAGNOSIS — M6281 Muscle weakness (generalized): Secondary | ICD-10-CM | POA: Diagnosis not present

## 2019-02-17 DIAGNOSIS — M6281 Muscle weakness (generalized): Secondary | ICD-10-CM | POA: Diagnosis not present

## 2019-02-17 DIAGNOSIS — M25512 Pain in left shoulder: Secondary | ICD-10-CM | POA: Diagnosis not present

## 2019-02-17 DIAGNOSIS — M25612 Stiffness of left shoulder, not elsewhere classified: Secondary | ICD-10-CM | POA: Diagnosis not present

## 2019-02-24 DIAGNOSIS — M25612 Stiffness of left shoulder, not elsewhere classified: Secondary | ICD-10-CM | POA: Diagnosis not present

## 2019-02-24 DIAGNOSIS — M6281 Muscle weakness (generalized): Secondary | ICD-10-CM | POA: Diagnosis not present

## 2019-02-24 DIAGNOSIS — M25512 Pain in left shoulder: Secondary | ICD-10-CM | POA: Diagnosis not present

## 2019-03-03 DIAGNOSIS — M25512 Pain in left shoulder: Secondary | ICD-10-CM | POA: Diagnosis not present

## 2019-03-03 DIAGNOSIS — M25612 Stiffness of left shoulder, not elsewhere classified: Secondary | ICD-10-CM | POA: Diagnosis not present

## 2019-03-03 DIAGNOSIS — M6281 Muscle weakness (generalized): Secondary | ICD-10-CM | POA: Diagnosis not present

## 2019-03-08 ENCOUNTER — Other Ambulatory Visit: Payer: Self-pay | Admitting: *Deleted

## 2019-03-08 MED ORDER — XHANCE 93 MCG/ACT NA EXHU: 2.0000 | INHALANT_SUSPENSION | Freq: Two times a day (BID) | NASAL | 3 refills | Status: DC

## 2019-03-10 DIAGNOSIS — M25512 Pain in left shoulder: Secondary | ICD-10-CM | POA: Diagnosis not present

## 2019-03-10 DIAGNOSIS — M25612 Stiffness of left shoulder, not elsewhere classified: Secondary | ICD-10-CM | POA: Diagnosis not present

## 2019-03-10 DIAGNOSIS — M6281 Muscle weakness (generalized): Secondary | ICD-10-CM | POA: Diagnosis not present

## 2019-03-23 DIAGNOSIS — Z4789 Encounter for other orthopedic aftercare: Secondary | ICD-10-CM | POA: Diagnosis not present

## 2019-03-24 DIAGNOSIS — M25612 Stiffness of left shoulder, not elsewhere classified: Secondary | ICD-10-CM | POA: Diagnosis not present

## 2019-03-24 DIAGNOSIS — M6281 Muscle weakness (generalized): Secondary | ICD-10-CM | POA: Diagnosis not present

## 2019-03-24 DIAGNOSIS — M25512 Pain in left shoulder: Secondary | ICD-10-CM | POA: Diagnosis not present

## 2019-04-30 ENCOUNTER — Other Ambulatory Visit: Payer: Self-pay | Admitting: Allergy & Immunology

## 2019-04-30 ENCOUNTER — Telehealth: Payer: Self-pay

## 2019-04-30 NOTE — Telephone Encounter (Signed)
Prior authorization for [facility] submitted via covermymeds. Currently awaiting approval/denial.

## 2019-04-30 NOTE — Telephone Encounter (Signed)
Approved and sent to the pharmacy.  

## 2019-05-19 ENCOUNTER — Other Ambulatory Visit: Payer: Self-pay | Admitting: Allergy & Immunology

## 2019-05-25 ENCOUNTER — Other Ambulatory Visit: Payer: Self-pay | Admitting: Allergy & Immunology

## 2019-08-12 ENCOUNTER — Ambulatory Visit: Payer: BC Managed Care – PPO | Attending: Internal Medicine

## 2019-08-12 DIAGNOSIS — Z23 Encounter for immunization: Secondary | ICD-10-CM

## 2019-08-12 NOTE — Progress Notes (Signed)
   Covid-19 Vaccination Clinic  Name:  MYKELL RAWL    MRN: [id number] DOB: [birth year]  08/12/2019  Mr. Wojnarowski was observed post Covid-19 immunization for 15 minutes without incident. He was provided with Vaccine Information Sheet and instruction to access the V-Safe system.   Mr. Hilyard was instructed to call 911 with any severe reactions post vaccine: Marland Kitchen Difficulty breathing  . Swelling of face and throat  . A fast heartbeat  . A bad rash all over body  . Dizziness and weakness   Immunizations Administered    Name Date Dose VIS Date Route   Pfizer COVID-19 Vaccine 08/12/2019  4:51 PM 0.3 mL 06/16/2018 Intramuscular   Manufacturer: [employer], [employer]   Lot: [id number]   NDC: [address]-4

## 2019-08-18 ENCOUNTER — Other Ambulatory Visit: Payer: Self-pay | Admitting: Allergy & Immunology

## 2019-08-18 NOTE — Telephone Encounter (Signed)
Denied refill Xhance.  Last OV 05/19/2018.  Per chart note: Return in about 3 months (around 08/18/2018). Left message on voice mail patient needs OV for further refills.

## 2019-08-26 ENCOUNTER — Other Ambulatory Visit: Payer: Self-pay | Admitting: *Deleted

## 2019-08-31 DIAGNOSIS — M5413 Radiculopathy, cervicothoracic region: Secondary | ICD-10-CM | POA: Diagnosis not present

## 2019-08-31 DIAGNOSIS — M542 Cervicalgia: Secondary | ICD-10-CM | POA: Diagnosis not present

## 2019-08-31 DIAGNOSIS — M9901 Segmental and somatic dysfunction of cervical region: Secondary | ICD-10-CM | POA: Diagnosis not present

## 2019-08-31 DIAGNOSIS — M9902 Segmental and somatic dysfunction of thoracic region: Secondary | ICD-10-CM | POA: Diagnosis not present

## 2019-09-06 ENCOUNTER — Ambulatory Visit: Payer: BC Managed Care – PPO

## 2019-09-16 ENCOUNTER — Ambulatory Visit: Payer: BC Managed Care – PPO | Attending: Internal Medicine

## 2019-09-16 DIAGNOSIS — Z23 Encounter for immunization: Secondary | ICD-10-CM

## 2019-09-16 NOTE — Progress Notes (Signed)
   Covid-19 Vaccination Clinic  Name:  Preston Simmons    MRN: [id number] DOB: 06/23/1980  09/16/2019  Mr. Preston Simmons was observed post Covid-19 immunization for 15 minutes without incident. He was provided with Vaccine Information Sheet and instruction to access the V-Safe system.   Mr. Preston Simmons was instructed to call 911 with any severe reactions post vaccine: Marland Kitchen Difficulty breathing  . Swelling of face and throat  . A fast heartbeat  . A bad rash all over body  . Dizziness and weakness   Immunizations Administered    Name Date Dose VIS Date Route   Pfizer COVID-19 Vaccine 09/16/2019  4:57 PM 0.3 mL 06/16/2018 Intramuscular   Manufacturer: [employer], [employer]   Lot: [id number]   NDC: [address]-2

## 2020-02-16 ENCOUNTER — Telehealth: Payer: Self-pay

## 2020-02-16 ENCOUNTER — Other Ambulatory Visit: Payer: Self-pay | Admitting: Allergy & Immunology

## 2020-02-21 ENCOUNTER — Other Ambulatory Visit: Payer: Self-pay

## 2020-02-21 ENCOUNTER — Ambulatory Visit: Payer: BC Managed Care – PPO | Admitting: Allergy

## 2020-02-21 ENCOUNTER — Encounter: Payer: Self-pay | Admitting: Allergy

## 2020-02-21 VITALS — BP 116/68 | HR 75 | Temp 98.1°F | Resp 14 | Ht 73.0 in | Wt 250.0 lb

## 2020-02-21 DIAGNOSIS — J3089 Other allergic rhinitis: Secondary | ICD-10-CM | POA: Diagnosis not present

## 2020-02-21 DIAGNOSIS — T7819XA Other adverse food reactions, not elsewhere classified, initial encounter: Secondary | ICD-10-CM | POA: Insufficient documentation

## 2020-02-21 DIAGNOSIS — J302 Other seasonal allergic rhinitis: Secondary | ICD-10-CM

## 2020-02-21 DIAGNOSIS — T781XXA Other adverse food reactions, not elsewhere classified, initial encounter: Secondary | ICD-10-CM | POA: Insufficient documentation

## 2020-02-21 DIAGNOSIS — E739 Lactose intolerance, unspecified: Secondary | ICD-10-CM | POA: Diagnosis not present

## 2020-02-21 HISTORY — DX: Other adverse food reactions, not elsewhere classified, initial encounter: T78.1XXA

## 2020-02-21 MED ORDER — AZELASTINE HCL 0.15 % NA SOLN: 1.0000 | Freq: Two times a day (BID) | NASAL | 5 refills | Status: DC | PRN

## 2020-02-21 MED ORDER — XHANCE 93 MCG/ACT NA EXHU: 2.0000 | INHALANT_SUSPENSION | Freq: Every day | NASAL | 5 refills | Status: DC

## 2020-02-21 NOTE — Assessment & Plan Note (Signed)
Past history - 2020 skin testing was positive to  ragweed, trees, grasses, indoor molds, outdoor molds, cat and cockroach. Interim history - doing well with below regimen and needs refills. Stopped AIT due to time conflicts.  - Continue environmental control measures. - Continue with Xhance 2 sprays per nostril once a day.  If it's not covered let us know.  - May use azelastine nasal spray 1-2 sprays per nostril twice a day as needed for runny nose/drainage. - May use over the counter antihistamines such as Zyrtec (cetirizine), Claritin (loratadine), Allegra (fexofenadine), or Xyzal (levocetirizine) daily as needed. - Consider re-starting allergy injections for long term control.

## 2020-02-21 NOTE — Patient Instructions (Addendum)
Seasonal and perennial allergic rhinitis - 2020 skin testing was positive to  ragweed, trees, grasses, indoor molds, outdoor molds, cat and cockroach - Continue environmental control measures. - Continue with Xhance 2 sprays per nostril once a day.  If it's not covered let us know.  - May use azelastine nasal spray 1-2 sprays per nostril twice a day as needed for runny nose/drainage. - May use over the counter antihistamines such as Zyrtec (cetirizine), Claritin (loratadine), Allegra (fexofenadine), or Xyzal (levocetirizine) daily as needed. - Consider re-starting allergy injections for long term control.  Adverse food reaction - 2020 testing was negative to cow's milk and casein. - Try using Lactaid tablets before ingesting dairy to see if this helps.  Follow up in 6 months or sooner if needed.

## 2020-02-21 NOTE — Assessment & Plan Note (Signed)
Past history - 2020 testing was negative to cow's milk and casein. Interim history - patient did not try lactaid tablets but still consumes dairy and deals with its effects. - Try using Lactaid tablets before ingesting dairy to see if this helps.

## 2020-02-21 NOTE — Progress Notes (Signed)
Follow Up Note  RE: Preston Simmons MRN: [id number] DOB: [birth year] Date of Office Visit: 02/21/2020  Referring provider: Darrow Bussing, MD Primary care provider: Darrow Bussing, MD  Chief Complaint: Allergic Rhinitis  (Says he thinks he needs antihistamine allergy medication along with what he's currently taking. no concerns currently)  History of Present Illness: I had the pleasure of seeing Preston Simmons for a [facility]  on 02/21/2020. He is a 39 y.o. male, who is being followed for allergic rhinitis and adverse food reaction. His previous allergy office visit was on 05/19/2018 with Dr. Dellis Anes. Today is a regular follow up visit.  1. Seasonal and perennial allergic rhinitis Currently taking Xhance 2 sprays per nostril once a day with good benefit. No nosebleeds. Takes OTC nasal spray as needed with some benefit additionally. No additional side effects. Patient was on allergy injections right before Covid shut down. Will think about restarting.   2. Adverse food reaction Did not try lactaid pills still consumes dairy and deals with the GI issues.  Assessment and Plan: Preston Simmons is a 39 y.o. male with: Seasonal and perennial allergic rhinitis Past history - 2020 skin testing was positive to  ragweed, trees, grasses, indoor molds, outdoor molds, cat and cockroach. Interim history - doing well with below regimen and needs refills. Stopped AIT due to time conflicts.  - Continue environmental control measures. - Continue with Xhance 2 sprays per nostril once a day.  If it's not covered let us know.  - May use azelastine nasal spray 1-2 sprays per nostril twice a day as needed for runny nose/drainage. - May use over the counter antihistamines such as Zyrtec (cetirizine), Claritin (loratadine), Allegra (fexofenadine), or Xyzal (levocetirizine) daily as needed. - Consider re-starting allergy injections for long term control.  Lactose  intolerance Past history - 2020 testing was negative to cow's milk and casein. Interim history - patient did not try lactaid tablets but still consumes dairy and deals with its effects. - Try using Lactaid tablets before ingesting dairy to see if this helps.  Return in about 6 months (around 08/20/2020).  Meds ordered this encounter  Medications  . Azelastine HCl 0.15 % SOLN    Sig: Place 1-2 sprays into the nose 2 (two) times daily as needed (nasal drainage).    Dispense:  30 mL    Refill:  5  . Fluticasone Propionate (XHANCE) 93 MCG/ACT EXHU    Sig: Place 2 sprays into the nose daily. One to two sprays per nostril twice a day as needed    Dispense:  32 mL    Refill:  5   Diagnostics: None.  Medication List:  Current Outpatient Medications  Medication Sig Dispense Refill  . EPINEPHrine (AUVI-Q) 0.3 mg/0.3 mL IJ SOAJ injection Inject 0.3 mLs (0.3 mg total) into the muscle as needed for anaphylaxis. 2 Device 1  . Azelastine HCl 0.15 % SOLN Place 1-2 sprays into the nose 2 (two) times daily as needed (nasal drainage). 30 mL 5  . cyclobenzaprine (FLEXERIL) 10 MG tablet Take 1 tablet (10 mg total) by mouth 3 (three) times daily as needed. (Patient not taking: Reported on 02/21/2020) 15 tablet 0  . Fluticasone Propionate (XHANCE) 93 MCG/ACT EXHU Place 2 sprays into the nose daily. One to two sprays per nostril twice a day as needed 32 mL 5  . ibuprofen (ADVIL,MOTRIN) 600 MG tablet Take 1 tablet (600 mg total) by mouth every 8 (eight) hours  as needed for mild pain. (Patient not taking: Reported on 02/21/2020) 15 tablet 0  . traMADol (ULTRAM) 50 MG tablet Take 1 tablet (50 mg total) by mouth every 6 (six) hours as needed. (Patient not taking: Reported on 02/21/2020) 10 tablet 0  . triamcinolone (KENALOG) 0.1 % paste  (Patient not taking: Reported on 02/21/2020)     No current facility-administered medications for this visit.   Allergies: Allergies  Allergen Reactions  . Grass Extracts  [Gramineae Pollens]    I reviewed his past medical history, social history, family history, and environmental history and no significant changes have been reported from his previous visit. Rotator cuff repair in 2020.  Review of Systems  Constitutional: Negative for appetite change, chills, fever and unexpected weight change.  HENT: Negative for congestion and rhinorrhea.   Eyes: Negative for itching.  Respiratory: Negative for cough, chest tightness, shortness of breath and wheezing.   Gastrointestinal: Negative for abdominal pain.  Skin: Negative for rash.  Allergic/Immunologic: Positive for environmental allergies.  Neurological: Negative for headaches.   Objective: BP 116/68   Pulse 75   Temp 98.1 F (36.7 C)   Resp 14   Ht 6\' 1"  (1.854 m)   Wt 250 lb (113.4 kg)   SpO2 99%   BMI 32.98 kg/m  Body mass index is 32.98 kg/m. Physical Exam Vitals and nursing note reviewed.  Constitutional:      Appearance: Normal appearance. He is well-developed.  HENT:     Head: Normocephalic and atraumatic.     Right Ear: External ear normal.     Left Ear: External ear normal.     Nose: Nose normal.     Mouth/Throat:     Mouth: Mucous membranes are moist.     Pharynx: Oropharynx is clear.  Eyes:     Conjunctiva/sclera: Conjunctivae normal.  Cardiovascular:     Rate and Rhythm: Normal rate and regular rhythm.     Heart sounds: Normal heart sounds. No murmur heard.   Pulmonary:     Effort: Pulmonary effort is normal.     Breath sounds: Normal breath sounds. No wheezing, rhonchi or rales.  Musculoskeletal:     Cervical back: Neck supple.  Skin:    General: Skin is warm.     Findings: No rash.  Neurological:     Mental Status: He is alert and oriented to person, place, and time.  Psychiatric:        Behavior: Behavior normal.    Previous notes and tests were reviewed. The plan was reviewed with the patient/family, and all questions/concerned were addressed.  It was my  pleasure to see Preston Simmons today and participate in his care. Please feel free to contact me with any questions or concerns.  Sincerely,  Barbara Cower, DO Allergy & Immunology  [facility] office: [phone] [facility] office: [phone]

## 2020-03-06 IMAGING — DX DG SINUSES COMPLETE 3+V
3 series · 3 of 3 positions shown · non-contrast
Comparison: None.

CLINICAL DATA: Maxillary sinus pressure and headaches 1 week.

EXAM:
PARANASAL SINUSES - COMPLETE 3 + VIEW

[dg sinuses complete (1 of 3)]
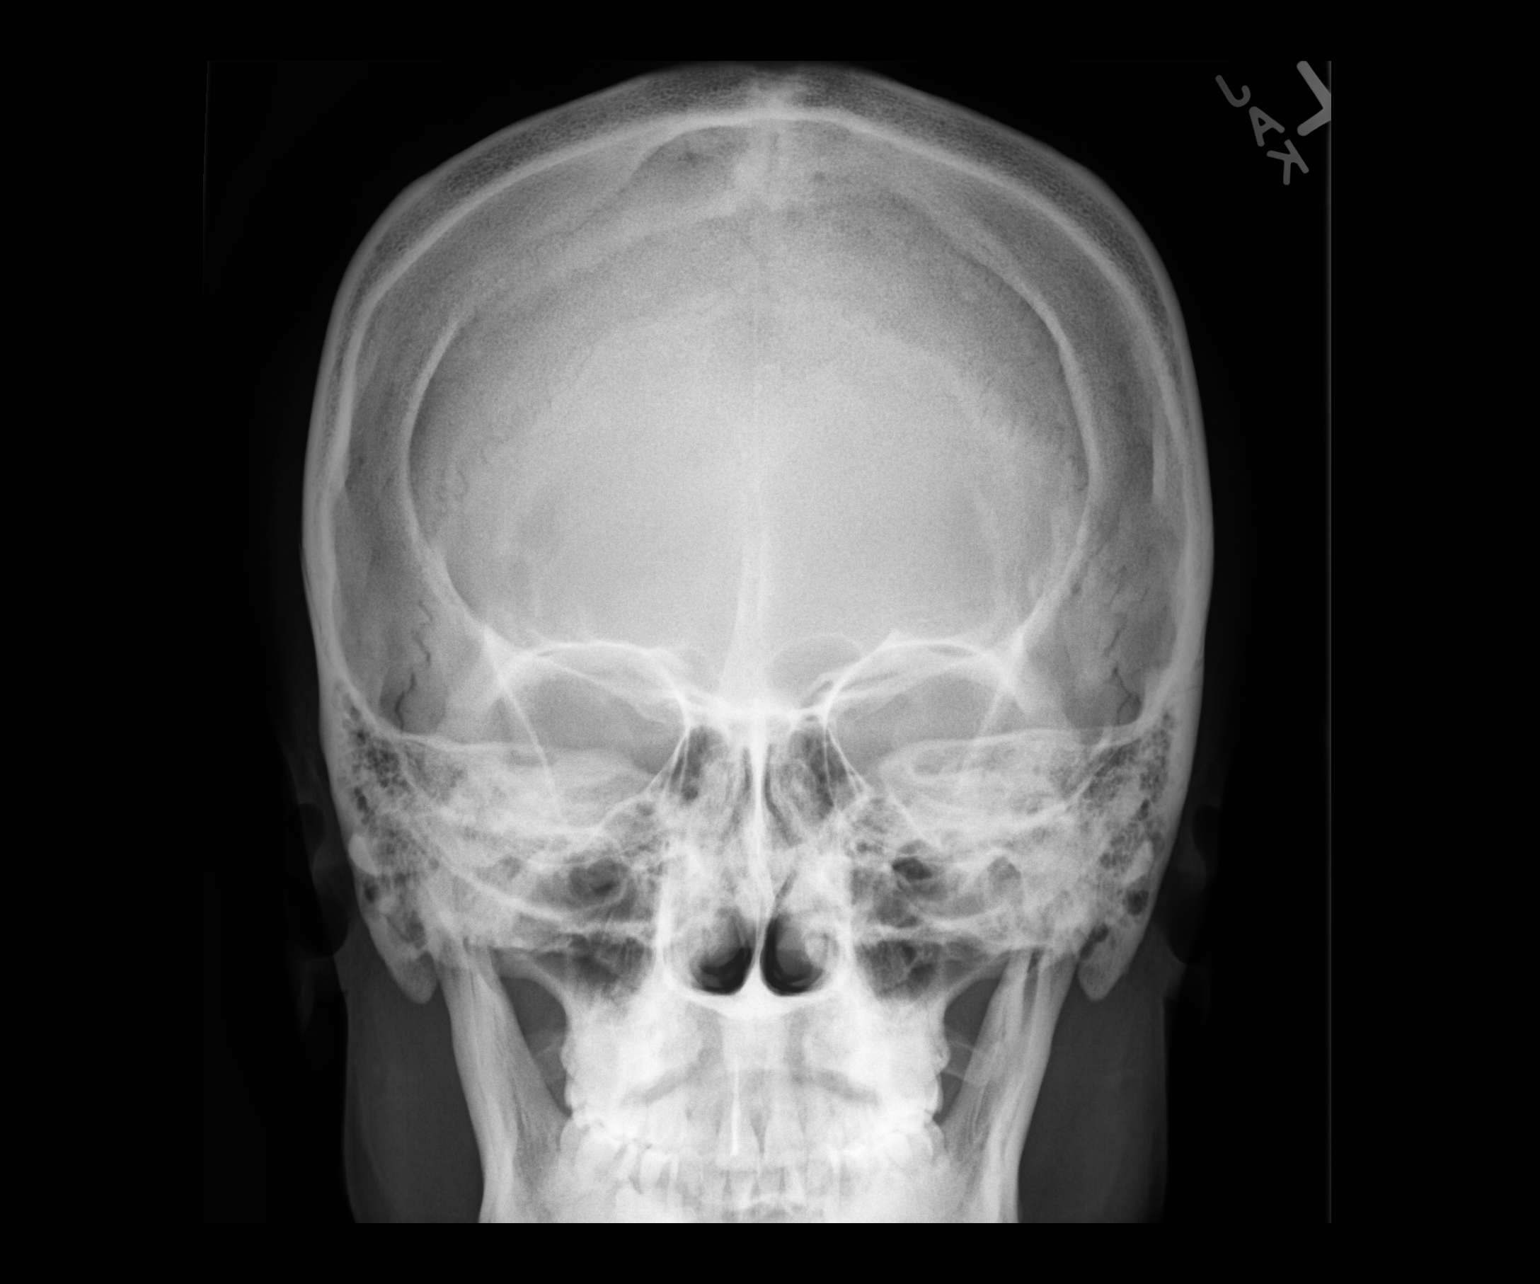

[dg sinuses complete (2 of 3)]
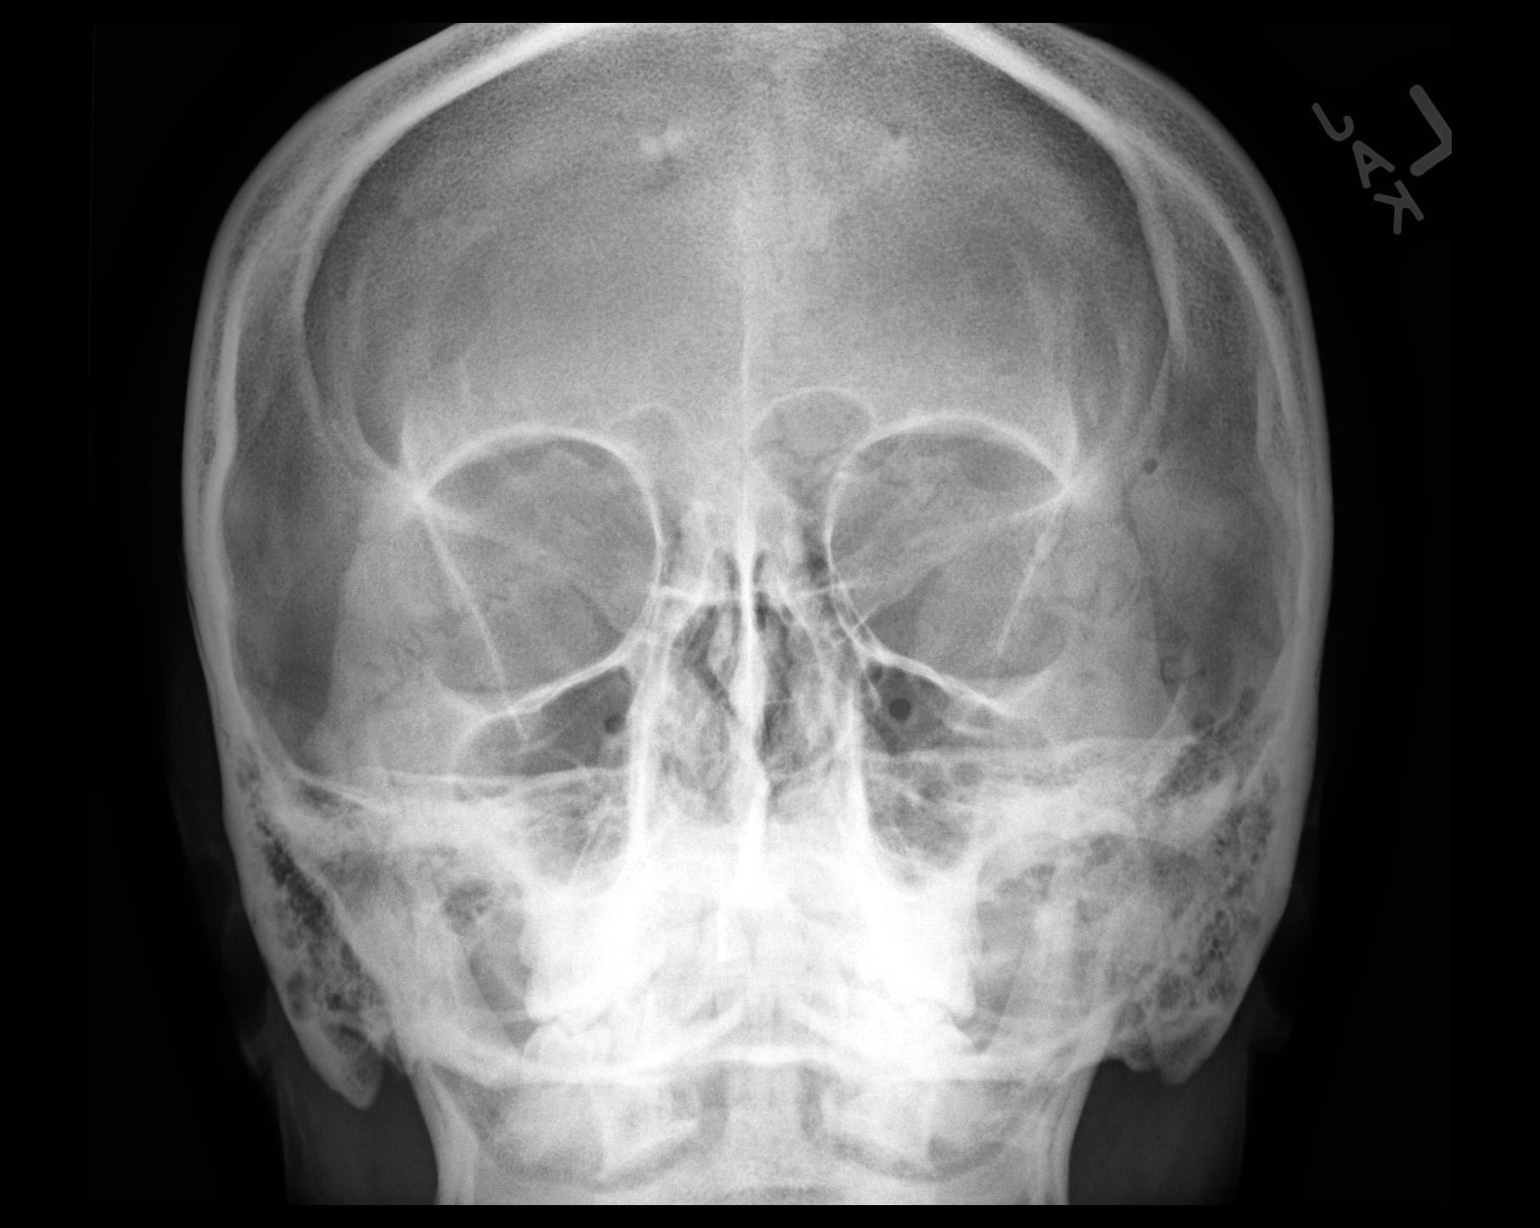

[dg sinuses complete (3 of 3)]
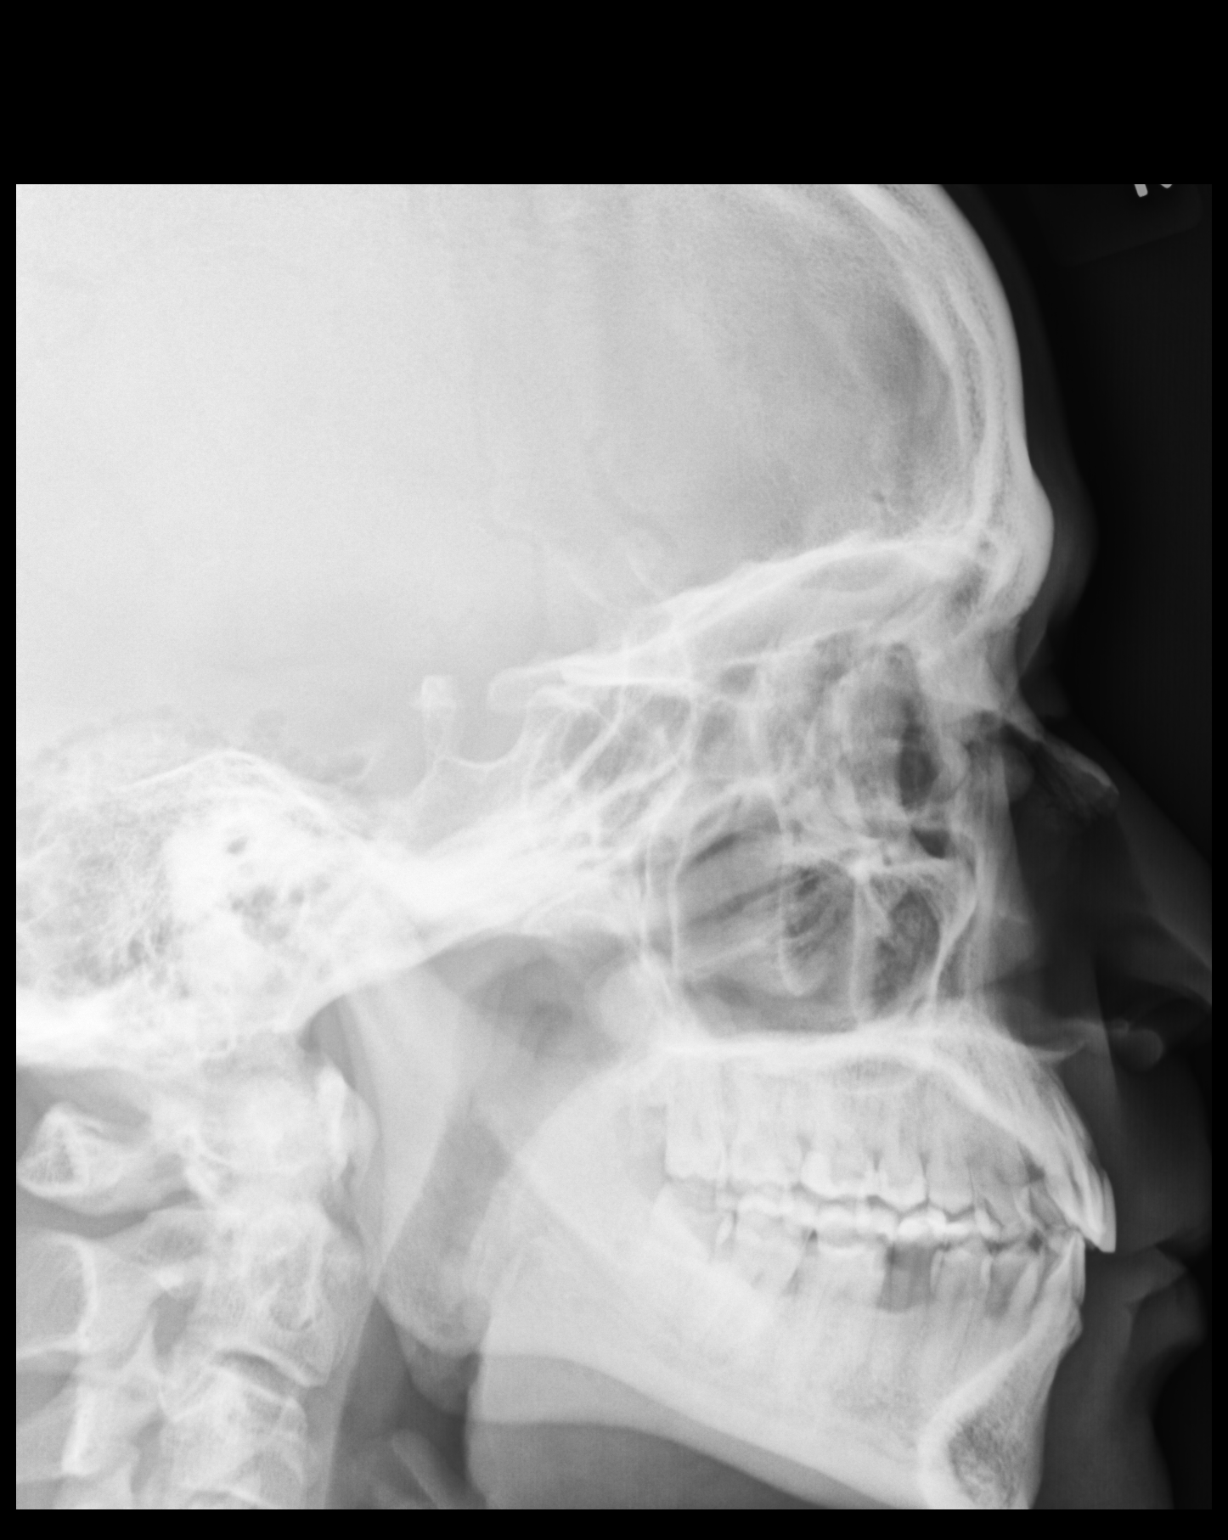

[3 of 3 positions shown; findings below may reference images not displayed]

FINDINGS: The paranasal sinus are aerated. There is no evidence of sinus
opacification air-fluid levels or mucosal thickening. No significant
bone abnormalities are seen.
IMPRESSION: Negative.

## 2020-04-23 DIAGNOSIS — R110 Nausea: Secondary | ICD-10-CM | POA: Diagnosis not present

## 2020-04-23 DIAGNOSIS — U071 COVID-19: Secondary | ICD-10-CM | POA: Diagnosis not present

## 2020-05-02 NOTE — Telephone Encounter (Signed)
Called to inform patient that he needed a appointment to get further refills as he hasn't been seen in over a year.
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
## 2020-06-13 DIAGNOSIS — Z13220 Encounter for screening for lipoid disorders: Secondary | ICD-10-CM | POA: Diagnosis not present

## 2020-06-13 DIAGNOSIS — Z0000 Encounter for general adult medical examination without abnormal findings: Secondary | ICD-10-CM | POA: Diagnosis not present

## 2020-07-27 ENCOUNTER — Telehealth: Payer: Self-pay | Admitting: Allergy

## 2020-07-27 NOTE — Telephone Encounter (Signed)
Patient called to see if a PA was done on his Xhance. Informed patient I did not have anything on file. Patient states he needs a refill sent to [facility], but a PA needs to be sent to his insurance first.  Please advise.

## 2020-07-28 ENCOUNTER — Other Ambulatory Visit: Payer: Self-pay

## 2020-07-28 MED ORDER — XHANCE 93 MCG/ACT NA EXHU: 2.0000 | INHALANT_SUSPENSION | Freq: Every day | NASAL | 5 refills | Status: DC

## 2020-07-28 NOTE — Telephone Encounter (Signed)
Will submit refill  of xhance and do pa for xhance also thru cover my meds

## 2020-07-28 NOTE — Telephone Encounter (Signed)
Pa has been submitted thru cover my meds

## 2020-08-02 NOTE — Telephone Encounter (Signed)
Preston Simmons is reaching out to the REP to see what is going on since the PA was cancelled. Preston Simmons has called and spoke with the patient and some samples have been placed up front for him in the [place] office to get him by until we determine the next step. Patient verbalized understanding.

## 2020-08-02 NOTE — Telephone Encounter (Signed)
Patient called today, 08/02/20, to check the progress on this prior authorization.

## 2020-08-07 NOTE — Telephone Encounter (Signed)
Lyla Son have you heard back from the Drug Rep regarding his Timmothy Sours?

## 2020-08-08 NOTE — Telephone Encounter (Signed)
Will call this patient tomorrow to inform.

## 2020-08-08 NOTE — Telephone Encounter (Signed)
Pt just needs to reach out to pharmacy to confirm a few things and then they can ship it out per [place] at [employer]. Apparently they have tried reaching out to him with no answer or response back

## 2020-08-09 NOTE — Telephone Encounter (Signed)
Spoke with patient.  Provided him with the phone number to [facility] to confirm questions they have.

## 2020-08-18 NOTE — Telephone Encounter (Signed)
[facility] have been competed and signed by Dr. Selena Batten and have been faxed off to them.

## 2020-08-25 NOTE — Telephone Encounter (Addendum)
PA has been approved effective 07/19/20 through 08/18/21 for Xhance. Patient informed.

## 2020-09-04 NOTE — Telephone Encounter (Signed)
Patient has samples up front that has been there since 08/01/20. I left a voicemail for the patient to see if he is still in need of these samples.

## 2020-10-26 ENCOUNTER — Telehealth: Payer: BC Managed Care – PPO

## 2020-10-26 NOTE — Telephone Encounter (Signed)
Patient picked up 2 xhance samples from the [facility] office that was left upfront for him on 08/01/20.

## 2020-11-14 DIAGNOSIS — Z20822 Contact with and (suspected) exposure to covid-19: Secondary | ICD-10-CM | POA: Diagnosis not present

## 2020-12-17 NOTE — Patient Instructions (Addendum)
Seasonal and perennial allergic rhinitis - 2020 skin testing was positive to  ragweed, trees, grasses, indoor molds, outdoor molds, cat and cockroach - Continue environmental control measures. -  We will send in a prescription for [facility] with a new diagnosis code to see if it is covered.  2 Samples of Xhance given- 2 sprays each nostril once a day as needed for stuffy nose. If it is not covered please call our office and we will send in a prescription for fluticasone, Nasonex, or Nasacort - May use olopatadine nasal spray  0.6% 1-2 sprays per nostril twice a day as needed for runny nose/drainage. If this is not covered you can get Astepro over the counter - May use over the counter antihistamines such as Zyrtec (cetirizine), Claritin (loratadine), Allegra (fexofenadine), or Xyzal (levocetirizine) daily as needed. - Consider re-starting allergy injections for long term control. CPT code information given to call insurance and find out cost. If you are interested in starting allergy injections after calling your insurance give our office a call to schedule an appointment to start allergy injections. -Start Pataday 0.2% using 1 drop each eye once a day as needed for itchy watery eyes. If this is not covered by your insurance you can get this over the counter. If you decide to get contacts place Pataday 1 drop in each eye as needed for itchy watery eyes and then wait 15-20 minutes before placing contacts  Adverse food reaction - 2020 testing was negative to cow's milk and casein. - Try using Lactaid tablets before ingesting dairy to see if this helps.  Follow up in 3 months or sooner if needed.

## 2020-12-18 ENCOUNTER — Other Ambulatory Visit: Payer: Self-pay

## 2020-12-18 ENCOUNTER — Encounter: Payer: Self-pay | Admitting: Family

## 2020-12-18 ENCOUNTER — Ambulatory Visit: Payer: BC Managed Care – PPO | Admitting: Family

## 2020-12-18 VITALS — BP 120/62 | HR 84 | Temp 97.9°F | Resp 18 | Ht 72.0 in | Wt 257.0 lb

## 2020-12-18 DIAGNOSIS — J3089 Other allergic rhinitis: Secondary | ICD-10-CM

## 2020-12-18 DIAGNOSIS — E739 Lactose intolerance, unspecified: Secondary | ICD-10-CM

## 2020-12-18 DIAGNOSIS — J331 Polypoid sinus degeneration: Secondary | ICD-10-CM | POA: Diagnosis not present

## 2020-12-18 DIAGNOSIS — J302 Other seasonal allergic rhinitis: Secondary | ICD-10-CM

## 2020-12-18 MED ORDER — OLOPATADINE HCL 0.6 % NA SOLN: NASAL | 5 refills | Status: DC

## 2020-12-18 MED ORDER — OLOPATADINE HCL 0.2 % OP SOLN
OPHTHALMIC | 5 refills | Status: DC
Start: 2020-12-18 — End: 2021-10-22

## 2020-12-18 MED ORDER — XHANCE 93 MCG/ACT NA EXHU: INHALANT_SUSPENSION | NASAL | 5 refills | Status: DC

## 2020-12-18 NOTE — Progress Notes (Signed)
[address] [place] [address] Dept: [phone]  FOLLOW UP NOTE  Patient ID: Preston Simmons, male    DOB: April 17, 1981  Age: 40 y.o. MRN: [id number] Date of Office Visit: 12/18/2020  Assessment  Chief Complaint: Follow-up (Patient in today to discuss medication change insurance does not cover xhance)  HPI Preston Simmons is a 40 year old male who presents today for follow-up of seasonal and perennial allergic rhinitis and lactose intolerance.  He was last seen on February 21, 2020 by Dr. Selena Batten.  Since we last saw him he was diagnosed with COVID-19 over New Year.  Seasonal and perennial allergic rhinitis is reported as not well controlled with Claritin 10 mg once a day.  He reports that he ran out of Timmothy Sours towards the beginning of the year and called [facility] and was told that his insurance no longer covers [place].  They did give him a sample.  He also called our office and we gave him a sample, but he has been out for a while now.  He also has not been able to get azelastine nasal spray from the pharmacy.  He reports that he would get a text saying the refill is ready to be picked up, but when he has gone twice he was told that it was backordered.  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  He reports postnasal drip and occasional itchy watery eyes and denies rhinorrhea, nasal congestion, and sneezing.  He has not had any sinus infections since we last saw him.  He continues to try to avoid dairy but if he does consume dairy he will occasionally drink 2% milk and will have symptoms, but not as bad as with whole milk.  He has not tried taking Lactaid tablets before eating dairy.   Drug Allergies:  Allergies  Allergen Reactions   Grass Extracts [Gramineae Pollens]     Review of Systems: Review of Systems   Constitutional:  Negative for chills and fever.  HENT:         Reports postnasal drip and denies rhinorrhea, nasal congestion, and sneezing  Eyes:        Reports occasional itchy watery eyes  Respiratory:  Negative for cough, shortness of breath and wheezing.   Cardiovascular:  Negative for chest pain and palpitations.  Gastrointestinal:        Denies heartburn and reflux symptoms  Genitourinary:  Negative for dysuria.  Skin:  Negative for itching and rash.  Neurological:  Negative for headaches.  Endo/Heme/Allergies:  Positive for environmental allergies.    Physical Exam: BP 120/62 (BP Location: Left Arm, Patient Position: Sitting, Cuff Size: Large)   Pulse 84   Temp 97.9 F (36.6 C) (Temporal)   Resp 18   Ht 6' (1.829 m)   Wt 257 lb (116.6 kg)   SpO2 97%   BMI 34.86 kg/m    Physical Exam Constitutional:      Appearance: Normal appearance.  HENT:     Head: Normocephalic and atraumatic.     Comments: Pharynx normal, eyes normal, ears normal, nose: Bilateral lower turbinates moderately edematous and slightly erythematous with no drainage noted    Right Ear: Tympanic membrane, ear canal and external ear normal.     Left Ear: Tympanic membrane, ear canal and external ear normal.  Mouth/Throat:     Mouth: Mucous membranes are moist.     Pharynx: Oropharynx is clear.  Eyes:     Conjunctiva/sclera: Conjunctivae normal.  Cardiovascular:     Rate and Rhythm: Normal rate and regular rhythm.     Heart sounds: Normal heart sounds.  Pulmonary:     Effort: Pulmonary effort is normal.     Breath sounds: Normal breath sounds.     Comments: Lungs clear to auscultation Musculoskeletal:     Cervical back: Neck supple.  Skin:    General: Skin is warm.  Neurological:     Mental Status: He is alert and oriented to person, place, and time.  Psychiatric:        Mood and Affect: Mood normal.        Behavior: Behavior normal.        Thought Content: Thought content normal.         Judgment: Judgment normal.    Diagnostics: None  Assessment and Plan: 1. Seasonal and perennial allergic rhinitis   2. Polypoid sinus degeneration   3. Lactose intolerance     Meds ordered this encounter  Medications   Fluticasone Propionate (XHANCE) 93 MCG/ACT EXHU    Sig: Place 2 sprays in each nostril once a day as needed for stuffy nose    Dispense:  32 mL    Refill:  5    J33.1   Olopatadine HCl 0.6 % SOLN    Sig: 1-2 sprays per nostril twice a day as needed for runny nose/drainage    Dispense:  30.5 g    Refill:  5   Olopatadine HCl (PATADAY) 0.2 % SOLN    Sig: Place 1 drop in each eye once a day as needed for itchy watery eyes. Use 15-20 minutes before placing contacts    Dispense:  2.5 mL    Refill:  5     Patient Instructions  Seasonal and perennial allergic rhinitis - 2020 skin testing was positive to  ragweed, trees, grasses, indoor molds, outdoor molds, cat and cockroach - Continue environmental control measures. -  We will send in a prescription for [facility] with a new diagnosis code to see if it is covered.  2 Samples of Xhance given- 2 sprays each nostril once a day as needed for stuffy nose. If it is not covered please call our office and we will send in a prescription for fluticasone, Nasonex, or Nasacort - Preston use olopatadine nasal spray  0.6% 1-2 sprays per nostril twice a day as needed for runny nose/drainage. If this is not covered you can get Astepro over the counter - Preston use over the counter antihistamines such as Zyrtec (cetirizine), Claritin (loratadine), Allegra (fexofenadine), or Xyzal (levocetirizine) daily as needed. - Consider re-starting allergy injections for long term control. CPT code information given to call insurance and find out cost. If you are interested in starting allergy injections after calling your insurance give our office a call to schedule an appointment to start allergy injections. -Start Pataday 0.2% using 1 drop each eye once a  day as needed for itchy watery eyes. If this is not covered by your insurance you can get this over the counter. If you decide to get contacts place Pataday 1 drop in each eye as needed for itchy watery eyes and then wait 15-20 minutes before placing contacts  Adverse food reaction - 2020 testing was negative to cow's milk and casein. - Try using Lactaid tablets before ingesting dairy to see  if this helps.  Follow up in 3 months or sooner if needed.     Return in about 3 months (around 03/20/2021), or if symptoms worsen or fail to improve.    Thank you for the opportunity to care for this patient.  Please do not hesitate to contact me with questions.  Nehemiah Settle, FNP [facility]

## 2021-02-15 ENCOUNTER — Telehealth: Payer: Self-pay

## 2021-02-15 MED ORDER — XHANCE 93 MCG/ACT NA EXHU: INHALANT_SUSPENSION | NASAL | 5 refills | Status: DC

## 2021-02-15 NOTE — Telephone Encounter (Signed)
Patient plans tp call Xhance tomorrow in regards to xhance shipment. He was told by [facility] that they were waiting for [place] to send something in for it to be approved. I let him know that we have not received any faxes regarding his prescription not being covered. [facility] sent in [place] with 5 refills on 12/18/20 with the diagnosis code J33.1.  Patient plans the office back after he call I gave him the phone number, diagnosis code, and date it was sent in. He will also stop by the [facility] office to pick up a sample.

## 2021-02-15 NOTE — Telephone Encounter (Signed)
Patient called stating he still hasnt been able to get his Preston Simmons since his last visit on 12/18/2020. He states he was told that we would change the diagnosis code that was sent over with the [facility] to get it approved.   Patient is currently out of the samples we have given him. I placed one up front for pick up.  Can someone look into this?  Thanks

## 2021-02-16 NOTE — Telephone Encounter (Signed)
Please let me know if Timmothy Sours is not covered and we will send in a prescription for either fluticasone, Nasonex or Nasacort.   Thanks, Nehemiah Settle, FNP

## 2021-02-20 NOTE — Telephone Encounter (Signed)
Patient plans to call [facility] today regarding the xhance prescription and then call our office back.

## 2021-02-20 NOTE — Telephone Encounter (Signed)
Ok thank you 

## 2021-02-20 NOTE — Telephone Encounter (Signed)
I called the patient to follow up about the xhance. I left a message for him to call the office back.

## 2021-02-21 ENCOUNTER — Other Ambulatory Visit: Payer: Self-pay | Admitting: Family

## 2021-02-21 ENCOUNTER — Other Ambulatory Visit: Payer: Self-pay

## 2021-02-21 MED ORDER — MOMETASONE FUROATE 50 MCG/ACT NA SUSP: NASAL | 5 refills | Status: AC

## 2021-02-21 NOTE — Telephone Encounter (Signed)
I sent in Nasonex 2 sprays each nostril once a day as needed for stuffy nose with 5 refills to [address] per the patient's request. I did inform the patient it would be ready for pick up at the pharmacy.

## 2021-02-21 NOTE — Telephone Encounter (Signed)
We can try Nasonex 2 sprays each nostril once a day as needed for stuffy nose. Quantity #1 with 5 refills.

## 2021-02-21 NOTE — Telephone Encounter (Signed)
I called the patient back the xhance is not covered by his insurance it would be a $55 copy for each shipment. The patient's insurance does cover Nasonex and Nasacort. He does not want the Flonase he wants something stronger please advise.  He would like it sent to the [facility] on [address].

## 2021-02-21 NOTE — Telephone Encounter (Signed)
I sent in Nasonex 2 sprays each nostril once a day as needed for stuffy nose with 5 refills to [address] per the patient's request.

## 2021-02-21 NOTE — Telephone Encounter (Signed)
Thank you :)

## 2021-02-21 NOTE — Telephone Encounter (Signed)
Thanks

## 2021-02-22 ENCOUNTER — Other Ambulatory Visit: Payer: Self-pay

## 2021-02-22 MED ORDER — TRIAMCINOLONE ACETONIDE 55 MCG/ACT NA AERO: 2.0000 | INHALATION_SPRAY | Freq: Every day | NASAL | 5 refills | Status: DC

## 2021-02-22 NOTE — Telephone Encounter (Signed)
I called the pharmacy and the pharmacist stated due to it being over the counter or having over the counter options it was not covered. He was not able to tell me if the Nasacort would have this similar issue. I sent in Nasacrot 2 sprays each nostril once a day as needed for stuffy nose.      

## 2021-02-22 NOTE — Telephone Encounter (Signed)
Ok. Thanks!

## 2021-02-22 NOTE — Telephone Encounter (Signed)
I called the pharmacy and the pharmacist stated due to it being over the counter or having over the counter options it was not covered. He was not able to tell me if the Nasacort would have this similar issue. I sent in Nasacrot 2 sprays each nostril once a day as needed for stuffy nose.

## 2021-02-22 NOTE — Telephone Encounter (Signed)
Ok. Thank you.

## 2021-02-22 NOTE — Telephone Encounter (Signed)
It looks like Nasonex is not covered,but the patient did not want fluticasone nasal spray if I remember correctly. Can try Nasacrot 2 sprays each nostril once a day as needed for stuffy nose.

## 2021-03-07 DIAGNOSIS — R220 Localized swelling, mass and lump, head: Secondary | ICD-10-CM | POA: Diagnosis not present

## 2021-06-29 DIAGNOSIS — Z13220 Encounter for screening for lipoid disorders: Secondary | ICD-10-CM | POA: Diagnosis not present

## 2021-06-29 DIAGNOSIS — Z0000 Encounter for general adult medical examination without abnormal findings: Secondary | ICD-10-CM | POA: Diagnosis not present

## 2021-06-29 DIAGNOSIS — Z1329 Encounter for screening for other suspected endocrine disorder: Secondary | ICD-10-CM | POA: Diagnosis not present

## 2021-07-09 ENCOUNTER — Encounter: Payer: Self-pay | Admitting: Family

## 2021-07-09 ENCOUNTER — Ambulatory Visit: Payer: BC Managed Care – PPO | Admitting: Family

## 2021-07-09 VITALS — BP 115/75 | HR 71 | Temp 98.1°F | Ht 72.0 in | Wt 253.5 lb

## 2021-07-09 DIAGNOSIS — E669 Obesity, unspecified: Secondary | ICD-10-CM | POA: Diagnosis not present

## 2021-07-09 DIAGNOSIS — K219 Gastro-esophageal reflux disease without esophagitis: Secondary | ICD-10-CM | POA: Diagnosis not present

## 2021-07-09 HISTORY — DX: Gastro-esophageal reflux disease without esophagitis: K21.9

## 2021-07-09 MED ORDER — PANTOPRAZOLE SODIUM 20 MG PO TBEC
20.0000 mg | DELAYED_RELEASE_TABLET | Freq: Every day | ORAL | 0 refills | Status: DC
Start: 2021-07-09 — End: 2021-07-23

## 2021-07-09 NOTE — Patient Instructions (Addendum)
Welcome to [facility]! It was a pleasure meeting you today. ? ?As discussed, I have sent a reflux medication to your pharmacy. Take as directed on the bottle and schedule a 1 month follow up visit today. ? ?I have also sent a referral to a nutritionist and they will call you directly to schedule an appointment. ? ? ? ?PLEASE NOTE: ? ?If you had any LAB tests please let us know if you have not heard back within a few days. You may see your results on MyChart before we have a chance to review them but we will give you a call once they are reviewed by [place]. If we ordered any REFERRALS today, please let us know if you have not heard from their office within the next week.  ?Let us know through MyChart if you are needing REFILLS, or have your pharmacy send [place] the request. You can also use MyChart to communicate with me or any office staff. ? ?Please try these tips to maintain a healthy lifestyle: ? ?Eat most of your calories during the day when you are active. Eliminate processed foods including packaged sweets (pies, cakes, cookies), reduce intake of potatoes, white bread, white pasta, and white rice. Look for whole grain options, oat flour or almond flour. ? ?Each meal should contain half fruits/vegetables, one quarter protein, and one quarter carbs (no bigger than a computer mouse). ? ?Cut down on sweet beverages. This includes juice, soda, and sweet tea. Also watch fruit intake, though this is a healthier sweet option, it still contains natural sugar! Limit to 3 servings daily. ? ?Drink at least 1 glass of water with each meal and aim for at least 8 glasses per day ? ?Exercise at least 150 minutes every week.  ? ?

## 2021-07-09 NOTE — Progress Notes (Signed)
? ?New Patient Office Visit ? ?Subjective:  ?Patient ID: Preston Simmons, male    DOB: 1981/03/26  Age: 41 y.o. MRN: [id number] ? ?CC:  ?Chief Complaint  ?Patient presents with  ? Establish Care  ? Gastroesophageal Reflux  ?  Pt c/o GERD after he eats and it is affecting his sleep for about a year. Has tried Tums, Pepcid, it does help.   ? ? ?HPI ?Preston Simmons presents for establishing care and to discuss one problem. ? ?GERD: Patient is presenting for follow up of symptoms. This has been associated with heartburn and sx occurring after lying down.  He denies abdominal bloating, belching, chest pain, cough, hoarseness, midespigastric pain, nausea, and need to clear throat frequently. Symptoms have been present for  years. He denies dysphagia.  He has not lost weight. He denies melena, hematochezia, hematemesis, and coffee ground emesis. Medical therapy in the past has included antacids. pt is obese. He has been evaluated and treated in past with OTC meds and dietary changes, but states his sx have worsened again. ?Obesity: Patient complains of obesity. Patient cites health, increased physical ability, self-image as reasons for wanting to lose weight. Amount of time at present weight: years. Greatest amount of weight lost: 30 lb over months, reports feeling good at around 225lbs. ?Circumstances associated with regain of weight: job changes, stress, eating poorly. Successful weight loss techniques attempted:  calorie restriction and more exercise . Unsuccessful weight loss techniques attempted: self-directed dieting. Exercise: cardiovascular workout on exercise equipment, weightlifting, and yard work. Number of regular meals per day: 2. Number of snacking episodes per day: 3. Binge behavior?: no. Eating precipitated by stress? sometimes. Use of alcohol: average 1 drinks/week. Use of medications that may cause weight gain none.  ? ?Past Medical History:  ?Diagnosis Date  ? Allergy   ? Gastroesophageal reflux disease  07/09/2021  ? ? ?Past Surgical History:  ?Procedure Laterality Date  ? ROTATOR CUFF REPAIR  10/2018  ? ? ?History reviewed. No pertinent family history. ? ?Social History  ? ?Socioeconomic History  ? Marital status: Single  ?  Spouse name: Not on file  ? Number of children: Not on file  ? Years of education: Not on file  ? Highest education level: Not on file  ?Occupational History  ? Not on file  ?Tobacco Use  ? Smoking status: Never  ? Smokeless tobacco: Never  ?Vaping Use  ? Vaping Use: Never used  ?Substance and Sexual Activity  ? Alcohol use: Yes  ?  Alcohol/week: 1.0 standard drink  ?  Types: 1 Cans of beer per week  ?  Comment: social  ? Drug use: Never  ? Sexual activity: Yes  ?  Birth control/protection: None  ?Other Topics Concern  ? Not on file  ?Social History Narrative  ? Not on file  ? ?Social Determinants of Health  ? ?Financial Resource Strain: Not on file  ?Food Insecurity: Not on file  ?Transportation Needs: Not on file  ?Physical Activity: Not on file  ?Stress: Not on file  ?Social Connections: Not on file  ?Intimate Partner Violence: Not on file  ? ? ?Objective:  ? ?Today's Vitals: BP 115/75 (BP Location: Left Arm, Patient Position: Sitting, Cuff Size: Large)   Pulse 71   Temp 98.1 ?F (36.7 ?C) (Temporal)   Wt 253 lb 8 oz (115 kg)   SpO2 95%   BMI 34.38 kg/m?  ? ?Physical Exam ?Vitals and nursing note reviewed.  ?Constitutional:   ?  General: He is not in acute distress. ?   Appearance: Normal appearance. He is obese.  ?HENT:  ?   Head: Normocephalic.  ?Cardiovascular:  ?   Rate and Rhythm: Normal rate and regular rhythm.  ?Pulmonary:  ?   Effort: Pulmonary effort is normal.  ?   Breath sounds: Normal breath sounds.  ?Musculoskeletal:     ?   General: Normal range of motion.  ?   Cervical back: Normal range of motion.  ?Skin: ?   General: Skin is warm and dry.  ?Neurological:  ?   Mental Status: He is alert and oriented to person, place, and time.  ?Psychiatric:     ?   Mood and Affect: Mood  normal.  ? ? ?Assessment & Plan:  ? ?Problem List Items Addressed This Visit   ? ?  ? Digestive  ? Gastroesophageal reflux disease - Primary  ?  Chronic - reports taking OTC meds in past and reducing acidic foods, but has not reduced caffeine, overweight, non-smoker. Sending Protonix, advised on use & SE, specific foods to avoid, handout provided, f/u 1 mo. ?  ?  ? Relevant Medications  ? pantoprazole (PROTONIX) 20 MG tablet  ?  ? Other  ? Obesity (BMI 30-39.9)  ?  Chronic - weight 9lbs higher 2 yrs ago. pt has been down to 225 and feels good at that wt. Has not been exercising as much, reports he did a light, easy workout on an exercise bike and his right ankle was swollen & painful after for a week & is hesitant - denies any previous injury (offered PT referral, he will consider) and feels he is eating too many calories. He wakes up around 2 or 3am and has to eat d/t hunger. Sending referral to nutritionist to discuss options. Wt. Loss strategies reviewed including portion control, less carbs including sweets, eating most of calories earlier in day, drinking 64oz water qd. ?  ?  ? Relevant Orders  ? Amb ref to Medical Nutrition Therapy-MNT  ? ? ?Outpatient Encounter Medications as of 07/09/2021  ?Medication Sig  ? EPINEPHrine (AUVI-Q) 0.3 mg/0.3 mL IJ SOAJ injection Inject 0.3 mLs (0.3 mg total) into the muscle as needed for anaphylaxis.  ? ibuprofen (ADVIL,MOTRIN) 600 MG tablet Take 1 tablet (600 mg total) by mouth every 8 (eight) hours as needed for mild pain.  ? mometasone (NASONEX) 50 MCG/ACT nasal spray Apply two sprays each in each nostril once a day as needed for stuffy nose.  ? Olopatadine HCl (PATADAY) 0.2 % SOLN Place 1 drop in each eye once a day as needed for itchy watery eyes. Use 15-20 minutes before placing contacts  ? Olopatadine HCl 0.6 % SOLN 1-2 sprays per nostril twice a day as needed for runny nose/drainage  ? pantoprazole (PROTONIX) 20 MG tablet Take 1 tablet (20 mg total) by mouth daily. Take  1 pill twice a day for 2 weeks, then 1 pill every morning.  ? triamcinolone (NASACORT ALLERGY 24HR) 55 MCG/ACT AERO nasal inhaler Place 2 sprays into the nose daily.  ? XHANCE 93 MCG/ACT EXHU Place 2 sprays into both nostrils 2 (two) times daily.  ? ?No facility-administered encounter medications on file as of 07/09/2021.  ? ? ?Follow-up: Return in about 4 weeks (around 08/06/2021) for GERD & , Complete physical w/fasting labs in 1 year.  ? ?Dulce Sellar, NP ?

## 2021-07-09 NOTE — Assessment & Plan Note (Signed)
Chronic - weight 9lbs higher 2 yrs ago. pt has been down to 225 and feels good at that wt. Has not been exercising as much, reports he did a light, easy workout on an exercise bike and his right ankle was swollen & painful after for a week & is hesitant - denies any previous injury (offered PT referral, he will consider) and feels he is eating too many calories. He wakes up around 2 or 3am and has to eat d/t hunger. Sending referral to nutritionist to discuss options. Wt. Loss strategies reviewed including portion control, less carbs including sweets, eating most of calories earlier in day, drinking 64oz water qd. ?

## 2021-07-09 NOTE — Assessment & Plan Note (Signed)
Chronic - reports taking OTC meds in past and reducing acidic foods, but has not reduced caffeine, overweight, non-smoker. Sending Protonix, advised on use & SE, specific foods to avoid, handout provided, f/u 1 mo. ?

## 2021-07-22 ENCOUNTER — Other Ambulatory Visit: Payer: Self-pay | Admitting: Family

## 2021-07-22 DIAGNOSIS — K219 Gastro-esophageal reflux disease without esophagitis: Secondary | ICD-10-CM

## 2021-09-26 DIAGNOSIS — R300 Dysuria: Secondary | ICD-10-CM | POA: Diagnosis not present

## 2021-10-21 NOTE — Progress Notes (Signed)
Subjective:     Patient ID: Preston Simmons, male    DOB: 14-Jun-1980, 41 y.o.   MRN: [id number]  Chief Complaint  Patient presents with   Weight Loss    Discuss medication, wegovy    HPI: Abnormal weight gain:  pt has been at current weight over a year, give or take about 5lbs, he would like to be closer to 230-235lb. Has worked on changing his diet, exercise, and building more muscle, but it has been hard to shed the pounds. Reports his wife has used [place] and is losing weight and her energy level has increased. He would like to try this medication as well.    Assessment & Plan:   Problem List Items Addressed This Visit       Other   Obesity (BMI 30-39.9)   Relevant Medications   Semaglutide-Weight Management (WEGOVY) 0.25 MG/0.5ML SOAJ   Abnormal weight gain - Primary    Chronic - starting WCHENI-  advised pt on use & SE. pt wife also on medication. Wt. Loss strategies reviewed including portion control, less carbs including sweets, eating most of calories earlier in day, drinking 64oz water qd, and establishing daily exercise routine. F/U in 1 month.      Relevant Medications   Semaglutide-Weight Management (WEGOVY) 0.25 MG/0.5ML SOAJ    Outpatient Medications Prior to Visit  Medication Sig Dispense Refill   EPINEPHrine (AUVI-Q) 0.3 mg/0.3 mL IJ SOAJ injection Inject 0.3 mLs (0.3 mg total) into the muscle as needed for anaphylaxis. 2 Device 1   ibuprofen (ADVIL,MOTRIN) 600 MG tablet Take 1 tablet (600 mg total) by mouth every 8 (eight) hours as needed for mild pain. 15 tablet 0   mometasone (NASONEX) 50 MCG/ACT nasal spray Apply two sprays each in each nostril once a day as needed for stuffy nose. 1 each 5   Olopatadine HCl 0.6 % SOLN 1-2 sprays per nostril twice a day as needed for runny nose/drainage 30.5 g 5   pantoprazole (PROTONIX) 20 MG tablet Take 1 tablet (20 mg total) by mouth daily. 90 tablet 1   triamcinolone (NASACORT ALLERGY 24HR) 55 MCG/ACT AERO nasal  inhaler Place 2 sprays into the nose daily. 1 each 5   XHANCE 93 MCG/ACT EXHU Place 2 sprays into both nostrils 2 (two) times daily.     Olopatadine HCl (PATADAY) 0.2 % SOLN Place 1 drop in each eye once a day as needed for itchy watery eyes. Use 15-20 minutes before placing contacts (Patient not taking: Reported on 10/22/2021) 2.5 mL 5   No facility-administered medications prior to visit.    Past Medical History:  Diagnosis Date   Allergy    Gastroesophageal reflux disease 07/09/2021    Past Surgical History:  Procedure Laterality Date   ROTATOR CUFF REPAIR  10/2018    Allergies  Allergen Reactions   Grass Extracts [Gramineae Pollens]        Objective:    Physical Exam Vitals and nursing note reviewed.  Constitutional:      General: He is not in acute distress.    Appearance: Normal appearance. He is obese.  HENT:     Head: Normocephalic.  Cardiovascular:     Rate and Rhythm: Normal rate and regular rhythm.  Pulmonary:     Effort: Pulmonary effort is normal.     Breath sounds: Normal breath sounds.  Musculoskeletal:        General: Normal range of motion.     Cervical back: Normal range of motion.  Skin:    General: Skin is warm and dry.  Neurological:     Mental Status: He is alert and oriented to person, place, and time.  Psychiatric:        Mood and Affect: Mood normal.    BP 126/80 (BP Location: Left Arm, Patient Position: Sitting, Cuff Size: Large)   Pulse 75   Temp 98.2 F (36.8 C) (Temporal)   Ht 6\' 1"  (1.854 m)   Wt 259 lb 4 oz (117.6 kg)   SpO2 97%   BMI 34.20 kg/m  Wt Readings from Last 3 Encounters:  10/22/21 259 lb 4 oz (117.6 kg)  07/09/21 253 lb 8 oz (115 kg)  12/18/20 257 lb (116.6 kg)     12/20/20, NP

## 2021-10-22 ENCOUNTER — Ambulatory Visit: Payer: BC Managed Care – PPO | Admitting: Family

## 2021-10-22 ENCOUNTER — Encounter: Payer: Self-pay | Admitting: Family

## 2021-10-22 VITALS — BP 126/80 | HR 75 | Temp 98.2°F | Ht 73.0 in | Wt 259.2 lb

## 2021-10-22 DIAGNOSIS — E669 Obesity, unspecified: Secondary | ICD-10-CM | POA: Diagnosis not present

## 2021-10-22 DIAGNOSIS — R635 Abnormal weight gain: Secondary | ICD-10-CM

## 2021-10-22 MED ORDER — WEGOVY 0.25 MG/0.5ML ~~LOC~~ SOAJ: 0.2500 mg | SUBCUTANEOUS | 0 refills | Status: DC

## 2021-10-22 NOTE — Patient Instructions (Signed)
It was very nice to see you today!   Start working on what we discussed today regarding your weight loss goals:  1) Eat small portions, ok to eat 6 mini meals vs. 3 big meals if this controls your hunger  better. 2) Eat until full and then STOP! This is your body telling you it has had enough. 3) Drink at least 64 oz water = 2 liters = 8, 8oz cups DAILY. 4) Eat most of your calories earlier in the day (if you work during the day).  Want to eat within a 8-10hour window if possible. No eating after 7pm, only no calorie drinks or water from 7p until bedtime. 5) Look for low carb, high protein foods/recipes. Avoid simple carbs including sweets, white bread, rice, potatoes, pasta. Look for whole grain/whole wheat/or almond flour if eating these foods. 6) High protein foods: meats (limit red meat), including [place], chicken, pork, FISH (best source) nuts, cheese, beans (black beans, soy/edamame beans are best). Protein drinks, bars are also good but must have low sugar, look for < 10 grams. 7) Avoid processed/refined sugar and artificial sweeteners if possible. Stevia, Erythritol, or Monk fruit sweetener are preferred, natural, no calorie sweeteners.  Natural sweeteners like Agave or honey in very small amounts are ok also.     PLEASE NOTE:  If you had any lab tests please let us know if you have not heard back within a few days. You may see your results on MyChart before we have a chance to review them but we will give you a call once they are reviewed by [place]. If we ordered any referrals today, please let us know if you have not heard from their office within the next week.

## 2021-10-22 NOTE — Assessment & Plan Note (Signed)
Chronic - starting OITGPQ-  advised pt on use & SE. pt wife also on medication. Wt. Loss strategies reviewed including portion control, less carbs including sweets, eating most of calories earlier in day, drinking 64oz water qd, and establishing daily exercise routine. F/U in 1 month.

## 2021-10-24 ENCOUNTER — Other Ambulatory Visit: Payer: Self-pay

## 2021-10-24 ENCOUNTER — Encounter: Payer: Self-pay | Admitting: Family

## 2021-10-24 ENCOUNTER — Telehealth: Payer: Self-pay | Admitting: Family

## 2021-10-24 DIAGNOSIS — R635 Abnormal weight gain: Secondary | ICD-10-CM

## 2021-10-24 DIAGNOSIS — E669 Obesity, unspecified: Secondary | ICD-10-CM

## 2021-10-24 MED ORDER — WEGOVY 0.25 MG/0.5ML ~~LOC~~ SOAJ: 0.2500 mg | SUBCUTANEOUS | 0 refills | Status: DC

## 2021-10-24 NOTE — Telephone Encounter (Signed)
Patient requests a 60 day or 90 day supply (.25 and .50 or .50 and 1.0) of Wegovy be sent to [facility] in [place] due to [place] back order of [employer].  Patient requests to be called at ph# [phone] once the above RX has been sent.

## 2021-10-24 NOTE — Telephone Encounter (Signed)
PA started and approved on 10/24/2021. I called pt and let him know.

## 2021-10-24 NOTE — Telephone Encounter (Signed)
I would send just 60days of 0.25mg  dose, with no refills, thanks.

## 2021-10-24 NOTE — Telephone Encounter (Signed)
Patient called [facility]  in [place] - [address]   St[place], structural. Patient would like medication to be sent to that location.    Patient would like a call back once called in

## 2021-10-25 NOTE — Telephone Encounter (Signed)
I called and spoke with Preston Simmons. Let Preston Simmons know his pharmacy said his copay will be $1200 for [facility]. Preston Simmons states he is going to find a pharmacy that has wegovy but his pharmacy does not have his dose.

## 2021-10-26 NOTE — Telephone Encounter (Signed)
Patient called to ensure that his Mychart message about his Reginal Lutes being available at [address] [place] has been received. I informed patient this was and will be addressed as soon as possible.

## 2021-10-26 NOTE — Telephone Encounter (Signed)
Pt pharmacy does not have the dose he is needing. Pt copay is also $1200, I called pt and let him know. Pt gave a verbalized understanding.

## 2021-10-29 ENCOUNTER — Other Ambulatory Visit: Payer: Self-pay | Admitting: Family

## 2021-10-29 DIAGNOSIS — R635 Abnormal weight gain: Secondary | ICD-10-CM

## 2021-10-29 DIAGNOSIS — E669 Obesity, unspecified: Secondary | ICD-10-CM

## 2021-10-29 MED ORDER — WEGOVY 0.25 MG/0.5ML ~~LOC~~ SOAJ: 0.2500 mg | SUBCUTANEOUS | 0 refills | Status: DC

## 2021-10-29 NOTE — Telephone Encounter (Signed)
Patient is requesting call back with update, as pharmacy only had a limited supply of this med. Patient can be reached back at [phone].

## 2021-11-06 ENCOUNTER — Encounter: Payer: Self-pay | Admitting: Family

## 2021-11-13 ENCOUNTER — Telehealth: Payer: Self-pay | Admitting: Allergy

## 2021-11-13 MED ORDER — XHANCE 93 MCG/ACT NA EXHU: 2.0000 | INHALANT_SUSPENSION | Freq: Two times a day (BID) | NASAL | 0 refills | Status: DC

## 2021-11-13 NOTE — Telephone Encounter (Signed)
Patient LOV: 12/18/20 was suppose to return in 3 mths.    Patient must keep appt for future medication refills.  New pharmacy information: [facility] [phone]   Electronically sent in medication refill for [facility] to pharmacy.

## 2021-11-13 NOTE — Telephone Encounter (Signed)
Patient called and made appointment for 08/17 with dr gallagher and need a refill on the [place] called into the mail order. [phone]

## 2021-11-19 ENCOUNTER — Ambulatory Visit: Payer: BC Managed Care – PPO | Admitting: Family

## 2021-11-19 ENCOUNTER — Ambulatory Visit (HOSPITAL_BASED_OUTPATIENT_CLINIC_OR_DEPARTMENT_OTHER)
Admission: RE | Admit: 2021-11-19 | Discharge: 2021-11-19 | Disposition: A | Discharge status: Home / Self Care | Payer: BC Managed Care – PPO | Source: Ambulatory Visit | Attending: Family | Admitting: Family

## 2021-11-19 ENCOUNTER — Encounter: Payer: Self-pay | Admitting: Family

## 2021-11-19 VITALS — BP 120/80 | HR 60 | Temp 98.0°F | Ht 73.0 in | Wt 248.4 lb

## 2021-11-19 DIAGNOSIS — R635 Abnormal weight gain: Secondary | ICD-10-CM

## 2021-11-19 DIAGNOSIS — S99922A Unspecified injury of left foot, initial encounter: Secondary | ICD-10-CM

## 2021-11-19 DIAGNOSIS — M79672 Pain in left foot: Secondary | ICD-10-CM | POA: Diagnosis not present

## 2021-11-19 MED ORDER — WEGOVY 0.5 MG/0.5ML ~~LOC~~ SOAJ: 0.5000 mg | SUBCUTANEOUS | 0 refills | Status: DC

## 2021-11-19 NOTE — Progress Notes (Signed)
Hey Preston Simmons -  Good news on the xray, nothing broken. Try to elevate your foot whenever resting, can keep applying ice for up to several times per day. OK to take Tylenol or up to 800mg  of Ibuprofen for pain 3 times per day for next few days.

## 2021-11-19 NOTE — Assessment & Plan Note (Addendum)
   Chronic  Lost 11lbs - mad dietary changes & started a regular exercise regimen.  Started PETKKO a few weeks ago, tolerating  Sending 0.5mg  dose  continue to advise on SE  f/u 1 mo

## 2021-11-19 NOTE — Patient Instructions (Signed)
It was very nice to see you today!   Go to the [facility] on [address] for your foot xray. I will review the result on MyChart after the radiologist has read it.  I have sent the higher dose of Wegovy to your pharmacy.  Schedule a 1 month f/u visit today.      PLEASE NOTE:  If you had any lab tests please let us know if you have not heard back within a few days. You may see your results on MyChart before we have a chance to review them but we will give you a call once they are reviewed by [place]. If we ordered any referrals today, please let us know if you have not heard from their office within the next week.

## 2021-11-19 NOTE — Progress Notes (Signed)
Patient ID: Preston Simmons, male    DOB: [birth year], 41 y.o.   MRN: [id number]  Chief Complaint  Patient presents with   Weight Loss   Toe Injury    Pt c/o toe pain after dropping lumbar off up the shopping cart onto his left big toe on 7/28. Pt has tried ibuprofen for the pain. Toe is swollen, redness and purple color.     HPI: Abnormal weight gain:  pt has been at current weight over a year, give or take about 5lbs, he would like to be closer to 230-235lb. Has worked on changing his diet, exercise, and building more muscle, but it has been hard to shed the pounds. Reports starting [facility] after challenges with finding the medication in stock. Denies any SE. Toe injury:  pt reports dropping lumbar onto his left great toe a few days ago, did not have evaluated, has continued to throb and having pain with swelling. He has taken Ibuprofen with only minimal relief.  Problem List Items Addressed This Visit       Other   Abnormal weight gain - Primary    Chronic Lost 11lbs - mad dietary changes & started a regular exercise regimen. Started KDTOIZ a few weeks ago, tolerating Sending 0.5mg  dose continue to advise on SE f/u 1 mo      Relevant Medications   Semaglutide-Weight Management (WEGOVY) 0.5 MG/0.5ML SOAJ   Other Visit Diagnoses     Toe injury, left, initial encounter    - left great toe & second toe swollen, red, bruised. ordering xray.    Relevant Orders   DG Foot 2 Views Left      Subjective:    Outpatient Medications Prior to Visit  Medication Sig Dispense Refill   EPINEPHrine (AUVI-Q) 0.3 mg/0.3 mL IJ SOAJ injection Inject 0.3 mLs (0.3 mg total) into the muscle as needed for anaphylaxis. 2 Device 1   ibuprofen (ADVIL,MOTRIN) 600 MG tablet Take 1 tablet (600 mg total) by mouth every 8 (eight) hours as needed for mild pain. 15 tablet 0   mometasone (NASONEX) 50 MCG/ACT nasal spray Apply two sprays each in each nostril once a day as needed for stuffy nose. 1 each 5    Olopatadine HCl 0.6 % SOLN 1-2 sprays per nostril twice a day as needed for runny nose/drainage 30.5 g 5   pantoprazole (PROTONIX) 20 MG tablet Take 1 tablet (20 mg total) by mouth daily. 90 tablet 1   triamcinolone (NASACORT ALLERGY 24HR) 55 MCG/ACT AERO nasal inhaler Place 2 sprays into the nose daily. 1 each 5   XHANCE 93 MCG/ACT EXHU Place 2 sprays into both nostrils 2 (two) times daily. 16 mL 0   Semaglutide-Weight Management (WEGOVY) 0.25 MG/0.5ML SOAJ Inject 0.25 mg into the skin once a week. 2 mL 0   No facility-administered medications prior to visit.   Past Medical History:  Diagnosis Date   Allergy    Gastroesophageal reflux disease 07/09/2021   Past Surgical History:  Procedure Laterality Date   ROTATOR CUFF REPAIR  10/2018   Allergies  Allergen Reactions   Grass Extracts [Gramineae Pollens]       Objective:    Physical Exam Vitals and nursing note reviewed.  Constitutional:      General: He is not in acute distress.    Appearance: Normal appearance. He is obese.  HENT:     Head: Normocephalic.  Cardiovascular:     Rate and Rhythm: Normal rate and regular rhythm.  Pulmonary:  Effort: Pulmonary effort is normal.     Breath sounds: Normal breath sounds.  Musculoskeletal:     Cervical back: Normal range of motion.     Left foot: Decreased range of motion.  Feet:     Left foot:     Skin integrity: Erythema (w/ecchymosis and swelling on great & 2nd digits) present.  Skin:    General: Skin is warm and dry.  Neurological:     Mental Status: He is alert and oriented to person, place, and time.  Psychiatric:        Mood and Affect: Mood normal.    BP 120/80 (BP Location: Left Arm, Patient Position: Sitting, Cuff Size: Large)   Pulse 60   Temp 98 F (36.7 C) (Temporal)   Ht 6\' 1"  (1.854 m)   Wt 248 lb 6 oz (112.7 kg)   SpO2 99%   BMI 32.77 kg/m  Wt Readings from Last 3 Encounters:  11/19/21 248 lb 6 oz (112.7 kg)  10/22/21 259 lb 4 oz (117.6 kg)   07/09/21 253 lb 8 oz (115 kg)       07/11/21, NP

## 2021-11-20 DIAGNOSIS — S90212A Contusion of left great toe with damage to nail, initial encounter: Secondary | ICD-10-CM | POA: Diagnosis not present

## 2021-11-28 DIAGNOSIS — L089 Local infection of the skin and subcutaneous tissue, unspecified: Secondary | ICD-10-CM | POA: Diagnosis not present

## 2021-11-28 DIAGNOSIS — S90212A Contusion of left great toe with damage to nail, initial encounter: Secondary | ICD-10-CM | POA: Diagnosis not present

## 2021-12-06 ENCOUNTER — Ambulatory Visit (INDEPENDENT_AMBULATORY_CARE_PROVIDER_SITE_OTHER): Payer: BC Managed Care – PPO | Admitting: Allergy & Immunology

## 2021-12-06 ENCOUNTER — Encounter: Payer: Self-pay | Admitting: Allergy & Immunology

## 2021-12-06 VITALS — BP 110/68 | HR 77 | Temp 97.9°F | Resp 16 | Wt 251.0 lb

## 2021-12-06 DIAGNOSIS — E739 Lactose intolerance, unspecified: Secondary | ICD-10-CM

## 2021-12-06 DIAGNOSIS — J302 Other seasonal allergic rhinitis: Secondary | ICD-10-CM

## 2021-12-06 DIAGNOSIS — J3089 Other allergic rhinitis: Secondary | ICD-10-CM

## 2021-12-06 DIAGNOSIS — J331 Polypoid sinus degeneration: Secondary | ICD-10-CM

## 2021-12-06 MED ORDER — XHANCE 93 MCG/ACT NA EXHU: 2.0000 | INHALANT_SUSPENSION | Freq: Two times a day (BID) | NASAL | 5 refills | Status: DC

## 2021-12-06 NOTE — Progress Notes (Signed)
FOLLOW UP  Date of Service/Encounter:  12/06/21   Assessment:   Seasonal and perennial allergic rhinitis (ragweed, trees, grasses, indoor molds, outdoor molds, cat and cockroach) - doing well on Xhance and Patanase as needed   Adverse food reaction (dairy) - with negative testing   Obesity - on Wegovy treatment  Plan/Recommendations:   1. Seasonal and perennial allergic rhinitis - Continue with Xhance one spray per nostril daily or so as you are doing.  - Continue with olopatadine as needed for particularly bad days.  - We can always start shots in the future again if needed.  2. Return in about 1 year (around 12/07/2022).   Subjective:   Preston Simmons is a 41 y.o. male presenting today for follow up of  Chief Complaint  Patient presents with   Medication Refill    Xhance,    Follow-up    No issues. Haven't had a sinus infection in a while.    Preston Simmons has a history of the following: Patient Active Problem List   Diagnosis Date Noted   Abnormal weight gain 10/22/2021   Gastroesophageal reflux disease 07/09/2021   Obesity (BMI 30-39.9) 07/09/2021   Adverse food reaction 02/21/2020   H/O repair of left rotator cuff 11/13/2018   Nontraumatic complete tear of left rotator cuff 10/10/2018   Seasonal and perennial allergic rhinitis 05/19/2018   Lactose intolerance 05/19/2018    History obtained from: chart review and patient.  Preston Simmons is a 41 y.o. male presenting for a follow up visit.  He was last seen in August 2022 by Preston Simmons, one of our nurse practitioners.  At that time, he was doing well on [place].  He had previously been on allergy shots, but stopped them due to insurance coverage.  He was encouraged to use an over-the-counter antihistamine as needed.  Allergy shots were discussed again and CPT codes provided.  Since last visit, he has largely done quite well.  Allergic Rhinitis Symptom History: He remains on the [place] which is  helping with her allergic rhinitis. The last time that he checked it was $60 ro so. IT works well. He hasn ot had a sinus infection in a number of years.  He does have Patanase to use when things get really bad.  However, he does not use this on a routine basis.  It apparently does not taste very good.  He is not sure if his insurance coverage for the allergy shots, but while he is stable and the no sprays alone he just wants to continue with those which makes sense.  He has not had any sinus infections at all.  He is on [facility] which he has been on for 8 weeks or so.  He has started to lose some weight.  He denies any side effects of it.  He continues to work for [employer].  He has really enjoyed this job.  They seem to treat him very well.  Otherwise, there have been no changes to his past medical history, surgical history, family history, or social history.    Review of Systems  Constitutional: Negative.  Negative for fever, malaise/fatigue and weight loss.  HENT:  Positive for congestion. Negative for ear discharge and ear pain.   Eyes:  Negative for pain, discharge and redness.  Respiratory:  Negative for cough, sputum production, shortness of breath and wheezing.   Cardiovascular: Negative.  Negative for chest pain and palpitations.  Gastrointestinal:  Negative for abdominal pain, heartburn,  nausea and vomiting.  Skin: Negative.  Negative for itching and rash.  Neurological:  Negative for dizziness and headaches.  Endo/Heme/Allergies:  Negative for environmental allergies. Does not bruise/bleed easily.       Objective:   Blood pressure 110/68, pulse 77, temperature 97.9 F (36.6 C), temperature source Temporal, resp. rate 16, weight 251 lb (113.9 kg), SpO2 99 %. Body mass index is 33.12 kg/m.    Physical Exam Constitutional:      Appearance: He is well-developed.  HENT:     Head: Normocephalic and atraumatic.     Right Ear: Tympanic membrane, ear canal and external ear normal.      Left Ear: Tympanic membrane, ear canal and external ear normal.     Nose: No nasal deformity, septal deviation, mucosal edema or rhinorrhea.     Right Turbinates: Not enlarged or swollen.     Left Turbinates: Not enlarged or swollen.     Right Sinus: No maxillary sinus tenderness or frontal sinus tenderness.     Left Sinus: No maxillary sinus tenderness or frontal sinus tenderness.     Mouth/Throat:     Mouth: Mucous membranes are not pale and not dry.     Pharynx: Uvula midline.  Eyes:     General: Lids are normal. No allergic shiner.       Right eye: No discharge.        Left eye: No discharge.     Conjunctiva/sclera: Conjunctivae normal.     Right eye: Right conjunctiva is not injected. No chemosis.    Left eye: Left conjunctiva is not injected. No chemosis.    Pupils: Pupils are equal, round, and reactive to light.  Cardiovascular:     Rate and Rhythm: Normal rate and regular rhythm.     Heart sounds: Normal heart sounds.  Pulmonary:     Effort: Pulmonary effort is normal. No tachypnea, accessory muscle usage or respiratory distress.     Breath sounds: Normal breath sounds. No wheezing, rhonchi or rales.  Chest:     Chest wall: No tenderness.  Lymphadenopathy:     Cervical: No cervical adenopathy.  Skin:    Coloration: Skin is not pale.     Findings: No abrasion, erythema, petechiae or rash. Rash is not papular, urticarial or vesicular.  Neurological:     Mental Status: He is alert.  Psychiatric:        Behavior: Behavior is cooperative.      Diagnostic studies: none      Malachi Bonds, MD  [facility]

## 2021-12-06 NOTE — Patient Instructions (Addendum)
1. Seasonal and perennial allergic rhinitis - Continue with Xhance one spray per nostril daily or so as you are doing.  - Continue with olopatadine as needed for particularly bad days.  - We can always start shots in the future again if needed.  2. Return in about 1 year (around 12/07/2022).    Please inform us of any Emergency Department visits, hospitalizations, or changes in symptoms. Call us before going to the ED for breathing or allergy symptoms since we might be able to fit you in for a sick visit. Feel free to contact us anytime with any questions, problems, or concerns.  It was a pleasure to see you again today!  Websites that have reliable patient information: 1. American Academy of Asthma, Allergy, and Immunology: www.aaaai.org 2. Food Allergy Research and Education (FARE): foodallergy.org 3. Mothers of Asthmatics: http://www.asthmacommunitynetwork.org 4. American College of Allergy, Asthma, and Immunology: www.acaai.org   COVID-19 Vaccine Information can be found at: [email or web address] For questions related to vaccine distribution or appointments, please email [email or web address] or call [phone].   We realize that you might be concerned about having an allergic reaction to the COVID19 vaccines. To help with that concern, WE ARE OFFERING THE COVID19 VACCINES IN OUR OFFICE! Ask the front desk for dates!     "Like" [place] on Facebook and Instagram for our latest updates!      A healthy democracy works best when [employer] participate! Make sure you are registered to vote! If you have moved or changed any of your contact information, you will need to get this updated before voting!  In some cases, you MAY be able to register to vote online: [email or web address]

## 2021-12-07 ENCOUNTER — Encounter: Payer: Self-pay | Admitting: Allergy & Immunology

## 2021-12-07 MED ORDER — OLOPATADINE HCL 0.6 % NA SOLN: NASAL | 5 refills | Status: AC

## 2021-12-16 NOTE — Progress Notes (Unsigned)
   Patient ID: Preston Simmons, male    DOB: 11-16-1980, 41 y.o.   MRN: [id number]  No chief complaint on file.   HPI: Abnormal weight gain:  pt has been at current weight over a year, give or take about 5lbs, he would like to be closer to 230-235lb. Has worked on changing his diet, exercise, and building more muscle, but it has been hard to shed the pounds. Reports starting [facility] after challenges with finding the medication in stock. Denies any SE.   Assessment & Plan:   Problem List Items Addressed This Visit   None   Subjective:    Outpatient Medications Prior to Visit  Medication Sig Dispense Refill   EPINEPHrine (AUVI-Q) 0.3 mg/0.3 mL IJ SOAJ injection Inject 0.3 mLs (0.3 mg total) into the muscle as needed for anaphylaxis. 2 Device 1   ibuprofen (ADVIL,MOTRIN) 600 MG tablet Take 1 tablet (600 mg total) by mouth every 8 (eight) hours as needed for mild pain. 15 tablet 0   mometasone (NASONEX) 50 MCG/ACT nasal spray Apply two sprays each in each nostril once a day as needed for stuffy nose. 1 each 5   Olopatadine HCl 0.6 % SOLN 1-2 sprays per nostril twice a day as needed for runny nose/drainage 30.5 g 5   pantoprazole (PROTONIX) 20 MG tablet Take 1 tablet (20 mg total) by mouth daily. 90 tablet 1   Semaglutide-Weight Management (WEGOVY) 0.5 MG/0.5ML SOAJ Inject 0.5 mg into the skin once a week. 2 mL 0   triamcinolone (NASACORT ALLERGY 24HR) 55 MCG/ACT AERO nasal inhaler Place 2 sprays into the nose daily. 1 each 5   XHANCE 93 MCG/ACT EXHU Place 2 sprays into both nostrils 2 (two) times daily. 16 mL 5   No facility-administered medications prior to visit.   Past Medical History:  Diagnosis Date   Allergy    Gastroesophageal reflux disease 07/09/2021   Past Surgical History:  Procedure Laterality Date   ROTATOR CUFF REPAIR  10/2018   Allergies  Allergen Reactions   Grass Extracts [Gramineae Pollens]       Objective:    Physical Exam Vitals and nursing note reviewed.   Constitutional:      General: He is not in acute distress.    Appearance: Normal appearance. He is obese.  HENT:     Head: Normocephalic.  Cardiovascular:     Rate and Rhythm: Normal rate and regular rhythm.  Pulmonary:     Effort: Pulmonary effort is normal.     Breath sounds: Normal breath sounds.  Musculoskeletal:        General: Normal range of motion.     Cervical back: Normal range of motion.  Skin:    General: Skin is warm and dry.  Neurological:     Mental Status: He is alert and oriented to person, place, and time.  Psychiatric:        Mood and Affect: Mood normal.  There were no vitals taken for this visit. Wt Readings from Last 3 Encounters:  12/06/21 251 lb (113.9 kg)  11/19/21 248 lb 6 oz (112.7 kg)  10/22/21 259 lb 4 oz (117.6 kg)       Dulce Sellar, NP

## 2021-12-17 ENCOUNTER — Ambulatory Visit: Payer: BC Managed Care – PPO | Admitting: Family

## 2021-12-17 ENCOUNTER — Encounter: Payer: Self-pay | Admitting: Family

## 2021-12-17 ENCOUNTER — Other Ambulatory Visit: Payer: Self-pay

## 2021-12-17 VITALS — BP 109/72 | HR 71 | Temp 97.6°F | Ht 73.0 in | Wt 239.6 lb

## 2021-12-17 DIAGNOSIS — R635 Abnormal weight gain: Secondary | ICD-10-CM

## 2021-12-17 DIAGNOSIS — E669 Obesity, unspecified: Secondary | ICD-10-CM | POA: Diagnosis not present

## 2021-12-17 MED ORDER — WEGOVY 1 MG/0.5ML ~~LOC~~ SOAJ
1.0000 mg | SUBCUTANEOUS | 0 refills | Status: DC
  Filled 2021-12-17: qty 2, 28d supply, fill #0

## 2021-12-17 NOTE — Assessment & Plan Note (Signed)
   Chronic  taking Wegovy 0.5mg  qweek  denies any SE  weight down another 11lbs  increasing dose to 1mg , refill sent  f/u in 1 month

## 2021-12-25 ENCOUNTER — Telehealth: Payer: Self-pay

## 2021-12-25 NOTE — Telephone Encounter (Signed)
Dena,[facility] - called in DOB verified - requesting status of [place] PA.  Dena was advised PA request had expired - she will renew PA request so I can process PA today.

## 2021-12-26 NOTE — Telephone Encounter (Signed)
PA has been submitted through CoverMyMeds for Xhance and is currently pending approval/denial.  

## 2021-12-27 NOTE — Telephone Encounter (Signed)
PA has been approved and sent electronically to pharmacy. PA is good for one year.  

## 2022-01-18 ENCOUNTER — Ambulatory Visit: Payer: BC Managed Care – PPO | Admitting: Family

## 2022-01-18 ENCOUNTER — Telehealth: Payer: Self-pay | Admitting: Family

## 2022-01-18 NOTE — Telephone Encounter (Signed)
Patient states: -He wanted to clarify that he did want the [facility] refilled but wanted to have an increase in dosage.   Patient is set for 4 week follow up on 01/21/22 @ 2:40pm.

## 2022-01-18 NOTE — Telephone Encounter (Signed)
  LAST APPOINTMENT DATE:  Please schedule appointment if longer than 1 year 12/17/21  NEXT APPOINTMENT DATE: 01/21/22  MEDICATION: Semaglutide-Weight Management (WEGOVY) 1 MG/0.5ML SOAJ  Is the patient out of medication? yes  PHARMACY:  [id number] - [place], [place] - [address] Phone:  [phone]  Fax:  [phone]

## 2022-01-20 NOTE — Progress Notes (Unsigned)
   Patient ID: Preston Simmons, male    DOB: [birth year], 41 y.o.   MRN: [id number]  No chief complaint on file.   HPI: Abnormal weight gain:  pt has been at current weight over a year, give or take about 5lbs, he would like to be closer to 205-230lb. Has worked on changing his diet, exercise, and building more muscle, but it has been hard to shed the pounds. Reports taking Wegovy 0.5mg  qweek, continues to deny any SE.   Assessment & Plan:   Problem List Items Addressed This Visit   None   Subjective:    Outpatient Medications Prior to Visit  Medication Sig Dispense Refill  . EPINEPHrine (AUVI-Q) 0.3 mg/0.3 mL IJ SOAJ injection Inject 0.3 mLs (0.3 mg total) into the muscle as needed for anaphylaxis. 2 Device 1  . ibuprofen (ADVIL,MOTRIN) 600 MG tablet Take 1 tablet (600 mg total) by mouth every 8 (eight) hours as needed for mild pain. 15 tablet 0  . mometasone (NASONEX) 50 MCG/ACT nasal spray Apply two sprays each in each nostril once a day as needed for stuffy nose. 1 each 5  . Olopatadine HCl 0.6 % SOLN 1-2 sprays per nostril twice a day as needed for runny nose/drainage 30.5 g 5  . pantoprazole (PROTONIX) 20 MG tablet Take 1 tablet (20 mg total) by mouth daily. 90 tablet 1  . Semaglutide-Weight Management (WEGOVY) 1 MG/0.5ML SOAJ Inject 1 mg into the skin once a week. 2 mL 0  . triamcinolone (NASACORT ALLERGY 24HR) 55 MCG/ACT AERO nasal inhaler Place 2 sprays into the nose daily. 1 each 5  . XHANCE 93 MCG/ACT EXHU Place 2 sprays into both nostrils 2 (two) times daily. 16 mL 5   No facility-administered medications prior to visit.   Past Medical History:  Diagnosis Date  . Allergy   . Gastroesophageal reflux disease 07/09/2021   Past Surgical History:  Procedure Laterality Date  . ROTATOR CUFF REPAIR  10/2018   Allergies  Allergen Reactions  . Grass Extracts [Gramineae Pollens]       Objective:    Physical Exam Vitals and nursing note reviewed.  Constitutional:       General: He is not in acute distress.    Appearance: Normal appearance.  HENT:     Head: Normocephalic.  Cardiovascular:     Rate and Rhythm: Normal rate and regular rhythm.  Pulmonary:     Effort: Pulmonary effort is normal.     Breath sounds: Normal breath sounds.  Musculoskeletal:        General: Normal range of motion.     Cervical back: Normal range of motion.  Skin:    General: Skin is warm and dry.  Neurological:     Mental Status: He is alert and oriented to person, place, and time.  Psychiatric:        Mood and Affect: Mood normal.  There were no vitals taken for this visit. Wt Readings from Last 3 Encounters:  12/17/21 239 lb 9.6 oz (108.7 kg)  12/06/21 251 lb (113.9 kg)  11/19/21 248 lb 6 oz (112.7 kg)       Jeanie Sewer, NP

## 2022-01-21 ENCOUNTER — Encounter: Payer: Self-pay | Admitting: Family

## 2022-01-21 ENCOUNTER — Ambulatory Visit: Payer: BC Managed Care – PPO | Admitting: Family

## 2022-01-21 VITALS — BP 112/72 | HR 73 | Temp 98.0°F | Ht 73.0 in | Wt 232.8 lb

## 2022-01-21 DIAGNOSIS — R635 Abnormal weight gain: Secondary | ICD-10-CM | POA: Diagnosis not present

## 2022-01-21 MED ORDER — WEGOVY 1.7 MG/0.75ML ~~LOC~~ SOAJ
1.7000 mg | SUBCUTANEOUS | 1 refills | Status: DC
  Filled 2022-01-22: qty 3, 28d supply, fill #0

## 2022-01-21 MED ORDER — WEGOVY 1.7 MG/0.75ML ~~LOC~~ SOAJ: 1.7000 mg | SUBCUTANEOUS | 0 refills | Status: DC

## 2022-01-21 NOTE — Assessment & Plan Note (Signed)
.   Chronic . taking Wegovy 1 mg qweek . denies any SE . weight down another 7 lbs . increasing dose to 1.7 mg, refill sent . f/u in 1 mo if wanting to increase dose again, 2 months if staying on same dose.

## 2022-01-22 ENCOUNTER — Other Ambulatory Visit: Payer: Self-pay

## 2022-02-18 ENCOUNTER — Ambulatory Visit: Payer: BC Managed Care – PPO | Admitting: Family

## 2022-02-18 ENCOUNTER — Other Ambulatory Visit: Payer: Self-pay

## 2022-02-18 ENCOUNTER — Encounter: Payer: Self-pay | Admitting: Family

## 2022-02-18 VITALS — BP 102/64 | HR 83 | Temp 97.5°F | Ht 73.0 in | Wt 228.1 lb

## 2022-02-18 DIAGNOSIS — R635 Abnormal weight gain: Secondary | ICD-10-CM

## 2022-02-18 MED ORDER — WEGOVY 2.4 MG/0.75ML ~~LOC~~ SOAJ
2.4000 mg | SUBCUTANEOUS | 1 refills | Status: DC
  Filled 2022-02-18: qty 3, 28d supply, fill #0
  Filled 2022-03-13: qty 3, 28d supply, fill #1

## 2022-02-18 NOTE — Assessment & Plan Note (Signed)
.   Chronic . taking Wegovy 1.7 mg qweek . denies any SE . weight down another 5 lbs . increasing dose to 2.4 mg, refill sent . 2 mo f/u

## 2022-02-18 NOTE — Progress Notes (Signed)
Patient ID: Preston Simmons, male    DOB: 09-08-1980, 41 y.o.   MRN: [id number]  Chief Complaint  Patient presents with   Weight Loss    Follow up    HPI: Abnormal weight gain:  pt has been at current weight over a year, give or take about 5lbs, he would like to be closer to 205-230lb. Has worked on changing his diet, exercise, and building more muscle, but it has been hard to shed the pounds. Reports taking Wegovy qweek, current dose is 1.7mg , continues to deny any SE, steadily losing weight along with exercise, eating mostly pescatarian diet.   Assessment & Plan:   Problem List Items Addressed This Visit       Other   Abnormal weight gain - Primary    Chronic taking Wegovy 1.7 mg qweek denies any SE weight down another 5 lbs increasing dose to 2.4 mg, refill sent 2 mo f/u       Relevant Medications   Semaglutide-Weight Management (WEGOVY) 2.4 MG/0.75ML SOAJ   Subjective:    Outpatient Medications Prior to Visit  Medication Sig Dispense Refill   EPINEPHrine (AUVI-Q) 0.3 mg/0.3 mL IJ SOAJ injection Inject 0.3 mLs (0.3 mg total) into the muscle as needed for anaphylaxis. 2 Device 1   ibuprofen (ADVIL,MOTRIN) 600 MG tablet Take 1 tablet (600 mg total) by mouth every 8 (eight) hours as needed for mild pain. 15 tablet 0   mometasone (NASONEX) 50 MCG/ACT nasal spray Apply two sprays each in each nostril once a day as needed for stuffy nose. 1 each 5   Olopatadine HCl 0.6 % SOLN 1-2 sprays per nostril twice a day as needed for runny nose/drainage 30.5 g 5   pantoprazole (PROTONIX) 20 MG tablet Take 1 tablet (20 mg total) by mouth daily. 90 tablet 1   triamcinolone (NASACORT ALLERGY 24HR) 55 MCG/ACT AERO nasal inhaler Place 2 sprays into the nose daily. 1 each 5   XHANCE 93 MCG/ACT EXHU Place 2 sprays into both nostrils 2 (two) times daily. 16 mL 5   Semaglutide-Weight Management (WEGOVY) 1.7 MG/0.75ML SOAJ Inject 1.7 mg into the skin once a week. 3 mL 1   No  facility-administered medications prior to visit.   Past Medical History:  Diagnosis Date   Allergy    Gastroesophageal reflux disease 07/09/2021   Past Surgical History:  Procedure Laterality Date   ROTATOR CUFF REPAIR  10/2018   Allergies  Allergen Reactions   Grass Extracts [Gramineae Pollens]       Objective:    Physical Exam Vitals and nursing note reviewed.  Constitutional:      General: He is not in acute distress.    Appearance: Normal appearance.  HENT:     Head: Normocephalic.  Cardiovascular:     Rate and Rhythm: Normal rate and regular rhythm.  Pulmonary:     Effort: Pulmonary effort is normal.     Breath sounds: Normal breath sounds.  Musculoskeletal:        General: Normal range of motion.     Cervical back: Normal range of motion.  Skin:    General: Skin is warm and dry.  Neurological:     Mental Status: He is alert and oriented to person, place, and time.  Psychiatric:        Mood and Affect: Mood normal.   BP 102/64 (BP Location: Left Arm, Patient Position: Sitting, Cuff Size: Large)   Pulse 83   Temp (!) 97.5 F (36.4 C) (  Temporal)   Ht 6\' 1"  (1.854 m)   Wt 228 lb 2 oz (103.5 kg)   SpO2 99%   BMI 30.10 kg/m  Wt Readings from Last 3 Encounters:  02/18/22 228 lb 2 oz (103.5 kg)  01/21/22 232 lb 12.8 oz (105.6 kg)  12/17/21 239 lb 9.6 oz (108.7 kg)    Jeanie Sewer, NP

## 2022-02-19 ENCOUNTER — Other Ambulatory Visit: Payer: Self-pay

## 2022-02-19 DIAGNOSIS — M9902 Segmental and somatic dysfunction of thoracic region: Secondary | ICD-10-CM | POA: Diagnosis not present

## 2022-02-19 DIAGNOSIS — M5413 Radiculopathy, cervicothoracic region: Secondary | ICD-10-CM | POA: Diagnosis not present

## 2022-02-19 DIAGNOSIS — M542 Cervicalgia: Secondary | ICD-10-CM | POA: Diagnosis not present

## 2022-02-19 DIAGNOSIS — M9901 Segmental and somatic dysfunction of cervical region: Secondary | ICD-10-CM | POA: Diagnosis not present

## 2022-02-28 DIAGNOSIS — M5413 Radiculopathy, cervicothoracic region: Secondary | ICD-10-CM | POA: Diagnosis not present

## 2022-02-28 DIAGNOSIS — M9902 Segmental and somatic dysfunction of thoracic region: Secondary | ICD-10-CM | POA: Diagnosis not present

## 2022-02-28 DIAGNOSIS — M9901 Segmental and somatic dysfunction of cervical region: Secondary | ICD-10-CM | POA: Diagnosis not present

## 2022-02-28 DIAGNOSIS — M542 Cervicalgia: Secondary | ICD-10-CM | POA: Diagnosis not present

## 2022-03-13 ENCOUNTER — Other Ambulatory Visit: Payer: Self-pay

## 2022-03-22 DIAGNOSIS — J019 Acute sinusitis, unspecified: Secondary | ICD-10-CM | POA: Diagnosis not present

## 2022-03-22 DIAGNOSIS — K120 Recurrent oral aphthae: Secondary | ICD-10-CM | POA: Diagnosis not present

## 2022-04-02 DIAGNOSIS — M5413 Radiculopathy, cervicothoracic region: Secondary | ICD-10-CM | POA: Diagnosis not present

## 2022-04-02 DIAGNOSIS — M9902 Segmental and somatic dysfunction of thoracic region: Secondary | ICD-10-CM | POA: Diagnosis not present

## 2022-04-02 DIAGNOSIS — M9901 Segmental and somatic dysfunction of cervical region: Secondary | ICD-10-CM | POA: Diagnosis not present

## 2022-04-02 DIAGNOSIS — M542 Cervicalgia: Secondary | ICD-10-CM | POA: Diagnosis not present

## 2022-04-16 ENCOUNTER — Other Ambulatory Visit: Payer: Self-pay

## 2022-04-16 ENCOUNTER — Other Ambulatory Visit: Payer: Self-pay | Admitting: Family

## 2022-04-16 DIAGNOSIS — R635 Abnormal weight gain: Secondary | ICD-10-CM

## 2022-04-16 MED ORDER — WEGOVY 2.4 MG/0.75ML ~~LOC~~ SOAJ
2.4000 mg | SUBCUTANEOUS | 1 refills | Status: DC
  Filled 2022-04-16: qty 3, 28d supply, fill #0

## 2022-04-17 ENCOUNTER — Other Ambulatory Visit: Payer: Self-pay

## 2022-04-29 ENCOUNTER — Encounter: Payer: Self-pay | Admitting: Family

## 2022-04-29 ENCOUNTER — Ambulatory Visit: Payer: BC Managed Care – PPO | Admitting: Family

## 2022-04-29 DIAGNOSIS — R635 Abnormal weight gain: Secondary | ICD-10-CM | POA: Diagnosis not present

## 2022-04-29 MED ORDER — WEGOVY 2.4 MG/0.75ML ~~LOC~~ SOAJ: 2.4000 mg | SUBCUTANEOUS | 0 refills | Status: DC

## 2022-04-29 NOTE — Progress Notes (Signed)
Patient ID: Preston Simmons, male    DOB: 12/29/1980, 42 y.o.   MRN: [id number]  Chief Complaint  Patient presents with   Weight Loss    HPI: Abnormal weight gain:  pt has been at current weight over a year, give or take about 5lbs, he would like to be closer to 205-230lb. Has worked on changing his diet, exercise, and building more muscle, but it has been hard to shed the pounds. Reports taking Wegovy qweek, current dose is 2.4mg , continues to deny any SE, weight had been coming off steadily, but regained 2lbs, has been without for last 2-3 weeks d/t insurance/pharmacy issue. Eating, eating mostly pescatarian diet.   Assessment & Plan:   Problem List Items Addressed This Visit       Other   Abnormal weight gain    Chronic taking Wegovy 2.4 mg qweek denies any SE weight up 2lbs, but pt w/o medication for last few weeks d/t insurance issue sending refill to [facility] 2-3 mo f/u       Relevant Medications   Semaglutide-Weight Management (WEGOVY) 2.4 MG/0.75ML SOAJ    Subjective:    Outpatient Medications Prior to Visit  Medication Sig Dispense Refill   EPINEPHrine (AUVI-Q) 0.3 mg/0.3 mL IJ SOAJ injection Inject 0.3 mLs (0.3 mg total) into the muscle as needed for anaphylaxis. 2 Device 1   ibuprofen (ADVIL,MOTRIN) 600 MG tablet Take 1 tablet (600 mg total) by mouth every 8 (eight) hours as needed for mild pain. 15 tablet 0   mometasone (NASONEX) 50 MCG/ACT nasal spray Apply two sprays each in each nostril once a day as needed for stuffy nose. 1 each 5   Olopatadine HCl 0.6 % SOLN 1-2 sprays per nostril twice a day as needed for runny nose/drainage 30.5 g 5   pantoprazole (PROTONIX) 20 MG tablet Take 1 tablet (20 mg total) by mouth daily. 90 tablet 1   triamcinolone (NASACORT ALLERGY 24HR) 55 MCG/ACT AERO nasal inhaler Place 2 sprays into the nose daily. 1 each 5   XHANCE 93 MCG/ACT EXHU Place 2 sprays into both nostrils 2 (two) times daily. 16 mL 5    Semaglutide-Weight Management (WEGOVY) 2.4 MG/0.75ML SOAJ Inject 2.4 mg into the skin once a week. 3 mL 1   No facility-administered medications prior to visit.   Past Medical History:  Diagnosis Date   Allergy    Gastroesophageal reflux disease 07/09/2021   Past Surgical History:  Procedure Laterality Date   ROTATOR CUFF REPAIR  10/2018   Allergies  Allergen Reactions   Grass Extracts [Gramineae Pollens]       Objective:    Physical Exam Vitals and nursing note reviewed.  Constitutional:      General: He is not in acute distress.    Appearance: Normal appearance.  HENT:     Head: Normocephalic.  Cardiovascular:     Rate and Rhythm: Normal rate and regular rhythm.  Pulmonary:     Effort: Pulmonary effort is normal.     Breath sounds: Normal breath sounds.  Musculoskeletal:        General: Normal range of motion.     Cervical back: Normal range of motion.  Skin:    General: Skin is warm and dry.  Neurological:     Mental Status: He is alert and oriented to person, place, and time.  Psychiatric:        Mood and Affect: Mood normal.    BP 112/74 (BP Location: Left Arm, Patient Position:  Sitting, Cuff Size: Large)   Pulse 80   Temp 98 F (36.7 C) (Temporal)   Ht 6\' 1"  (1.854 m)   Wt 230 lb 3.2 oz (104.4 kg)   SpO2 97%   BMI 30.37 kg/m  Wt Readings from Last 3 Encounters:  04/29/22 230 lb 3.2 oz (104.4 kg)  02/18/22 228 lb 2 oz (103.5 kg)  01/21/22 232 lb 12.8 oz (105.6 kg)       Jeanie Sewer, NP

## 2022-04-29 NOTE — Assessment & Plan Note (Addendum)
Chronic - goal wt is 215lbs taking Wegovy 2.4 mg qweek denies any SE weight up 2lbs, but pt w/o medication for last few weeks d/t insurance issue sending refill to [facility] 2-3 mo f/u

## 2022-06-11 DIAGNOSIS — M25811 Other specified joint disorders, right shoulder: Secondary | ICD-10-CM | POA: Diagnosis not present

## 2022-06-11 DIAGNOSIS — R52 Pain, unspecified: Secondary | ICD-10-CM | POA: Diagnosis not present

## 2022-06-28 DIAGNOSIS — M19011 Primary osteoarthritis, right shoulder: Secondary | ICD-10-CM | POA: Diagnosis not present

## 2022-07-09 DIAGNOSIS — M7521 Bicipital tendinitis, right shoulder: Secondary | ICD-10-CM | POA: Diagnosis not present

## 2022-07-10 DIAGNOSIS — G8929 Other chronic pain: Secondary | ICD-10-CM | POA: Diagnosis not present

## 2022-07-10 DIAGNOSIS — M25511 Pain in right shoulder: Secondary | ICD-10-CM | POA: Diagnosis not present

## 2022-08-02 ENCOUNTER — Ambulatory Visit: Payer: BC Managed Care – PPO | Admitting: Family

## 2022-08-02 VITALS — BP 107/72 | HR 84 | Temp 98.0°F | Ht 73.0 in | Wt 222.0 lb

## 2022-08-02 DIAGNOSIS — R635 Abnormal weight gain: Secondary | ICD-10-CM | POA: Diagnosis not present

## 2022-08-02 DIAGNOSIS — S8991XD Unspecified injury of right lower leg, subsequent encounter: Secondary | ICD-10-CM | POA: Diagnosis not present

## 2022-08-02 DIAGNOSIS — Z4802 Encounter for removal of sutures: Secondary | ICD-10-CM

## 2022-08-02 MED ORDER — WEGOVY 2.4 MG/0.75ML ~~LOC~~ SOAJ
2.4000 mg | SUBCUTANEOUS | 0 refills | Status: DC
Start: 2022-08-02 — End: 2022-09-06

## 2022-08-02 NOTE — Progress Notes (Signed)
   Patient ID: Preston Simmons, male    DOB: 05/08/1980, 42 y.o.   MRN: [id number]  Chief Complaint  Patient presents with  . Weight Loss    HPI: Abnormal weight gain:  pt has been at current weight over a year, give or take about 5lbs, he would like to be closer to 205-230lb. Has worked on changing his diet, exercise, and building more muscle, but it has been hard to shed the pounds. Reports taking Wegovy qweek, current dose is 2.4mg , continues to deny any SE, weight had been coming off steadily, but regained 2lbs, has been without for last 2-3 weeks d/t insurance/pharmacy issue. Eating, eating mostly pescatarian diet.  Right leg injury:  pt received 7 staples on anterior thigh and advised to have removed no sooner than 5 days.   Assessment & Plan:  There are no diagnoses linked to this encounter.  Subjective:    Outpatient Medications Prior to Visit  Medication Sig Dispense Refill  . EPINEPHrine (AUVI-Q) 0.3 mg/0.3 mL IJ SOAJ injection Inject 0.3 mLs (0.3 mg total) into the muscle as needed for anaphylaxis. 2 Device 1  . ibuprofen (ADVIL,MOTRIN) 600 MG tablet Take 1 tablet (600 mg total) by mouth every 8 (eight) hours as needed for mild pain. 15 tablet 0  . mometasone (NASONEX) 50 MCG/ACT nasal spray Apply two sprays each in each nostril once a day as needed for stuffy nose. 1 each 5  . Olopatadine HCl 0.6 % SOLN 1-2 sprays per nostril twice a day as needed for runny nose/drainage 30.5 g 5  . pantoprazole (PROTONIX) 20 MG tablet Take 1 tablet (20 mg total) by mouth daily. 90 tablet 1  . Semaglutide-Weight Management (WEGOVY) 2.4 MG/0.75ML SOAJ Inject 2.4 mg into the skin once a week. 9 mL 0  . triamcinolone (NASACORT ALLERGY 24HR) 55 MCG/ACT AERO nasal inhaler Place 2 sprays into the nose daily. 1 each 5  . XHANCE 93 MCG/ACT EXHU Place 2 sprays into both nostrils 2 (two) times daily. 16 mL 5   No facility-administered medications prior to visit.   Past Medical History:  Diagnosis  Date  . Allergy   . Gastroesophageal reflux disease 07/09/2021   Past Surgical History:  Procedure Laterality Date  . ROTATOR CUFF REPAIR  10/2018   Allergies  Allergen Reactions  . Grass Extracts [Gramineae Pollens]       Objective:    Physical Exam BP 107/72   Pulse 84   Temp 98 F (36.7 C) (Temporal)   Ht 6\' 1"  (1.854 m)   Wt 222 lb (100.7 kg)   SpO2 100%   BMI 29.29 kg/m  Wt Readings from Last 3 Encounters:  08/02/22 222 lb (100.7 kg)  04/29/22 230 lb 3.2 oz (104.4 kg)  02/18/22 228 lb 2 oz (103.5 kg)       Dulce Sellar, NP

## 2022-08-04 ENCOUNTER — Encounter: Payer: Self-pay | Admitting: Family

## 2022-08-04 NOTE — Assessment & Plan Note (Signed)
Chronic - goal wt is 215lbs taking Wegovy 2.4 mg qweek denies any SE wt back down a few lbs sending refill to [facility] 2-3 mo f/u

## 2022-08-23 ENCOUNTER — Other Ambulatory Visit (HOSPITAL_COMMUNITY): Payer: Self-pay

## 2022-08-28 ENCOUNTER — Other Ambulatory Visit (HOSPITAL_COMMUNITY): Payer: Self-pay

## 2022-09-05 ENCOUNTER — Other Ambulatory Visit: Payer: Self-pay | Admitting: Family

## 2022-09-05 DIAGNOSIS — R635 Abnormal weight gain: Secondary | ICD-10-CM

## 2022-09-11 ENCOUNTER — Telehealth: Payer: Self-pay

## 2022-09-11 ENCOUNTER — Other Ambulatory Visit (HOSPITAL_COMMUNITY): Payer: Self-pay

## 2022-09-11 NOTE — Telephone Encounter (Signed)
Pharmacy Patient Advocate Encounter  Prior Authorization for Preston Simmons has been submitted to and approved by [employer] (ins).    PA # [id number] Effective dates: 08/12/2022 through 04/09/2023

## 2022-09-11 NOTE — Telephone Encounter (Signed)
Please start PA for continuing therapy for wegovy.

## 2022-09-11 NOTE — Telephone Encounter (Signed)
I called pt and LVM in regards.  

## 2022-09-12 ENCOUNTER — Other Ambulatory Visit (HOSPITAL_COMMUNITY): Payer: Self-pay

## 2023-02-06 ENCOUNTER — Other Ambulatory Visit: Payer: Self-pay

## 2023-02-07 ENCOUNTER — Other Ambulatory Visit: Payer: Self-pay | Admitting: Allergy & Immunology

## 2023-02-13 ENCOUNTER — Ambulatory Visit: Payer: BC Managed Care – PPO | Admitting: Allergy & Immunology

## 2023-02-13 ENCOUNTER — Other Ambulatory Visit: Payer: Self-pay

## 2023-02-13 ENCOUNTER — Encounter: Payer: Self-pay | Admitting: Allergy & Immunology

## 2023-02-13 VITALS — BP 100/70 | HR 82 | Temp 98.0°F | Wt 221.3 lb

## 2023-02-13 DIAGNOSIS — J331 Polypoid sinus degeneration: Secondary | ICD-10-CM

## 2023-02-13 DIAGNOSIS — J302 Other seasonal allergic rhinitis: Secondary | ICD-10-CM | POA: Diagnosis not present

## 2023-02-13 DIAGNOSIS — J3089 Other allergic rhinitis: Secondary | ICD-10-CM

## 2023-02-13 MED ORDER — RYALTRIS 665-25 MCG/ACT NA SUSP: 2.0000 | Freq: Two times a day (BID) | NASAL | 5 refills | Status: AC | PRN

## 2023-02-13 NOTE — Patient Instructions (Addendum)
1. Seasonal and perennial allergic rhinitis - We are going to try to switch to Ryaltris.  - Start Ryaltris one spray per nostril up to twice daily. - This should be $39 and will be delivered from [facility] in [place].   2. Return in about 1 year (around 02/13/2024).    Please inform us of any Emergency Department visits, hospitalizations, or changes in symptoms. Call us before going to the ED for breathing or allergy symptoms since we might be able to fit you in for a sick visit. Feel free to contact us anytime with any questions, problems, or concerns.  It was a pleasure to see you again today!  Websites that have reliable patient information: 1. American Academy of Asthma, Allergy, and Immunology: www.aaaai.org 2. Food Allergy Research and Education (FARE): foodallergy.org 3. Mothers of Asthmatics: http://www.asthmacommunitynetwork.org 4. American College of Allergy, Asthma, and Immunology: www.acaai.org   COVID-19 Vaccine Information can be found at: [email or web address] For questions related to vaccine distribution or appointments, please email [email or web address] or call [phone].   We realize that you might be concerned about having an allergic reaction to the COVID19 vaccines. To help with that concern, WE ARE OFFERING THE COVID19 VACCINES IN OUR OFFICE! Ask the front desk for dates!     "Like" [place] on Facebook and Instagram for our latest updates!      A healthy democracy works best when [employer] participate! Make sure you are registered to vote! If you have moved or changed any of your contact information, you will need to get this updated before voting!  In some cases, you MAY be able to register to vote online: [email or web address]     Allergy Shots  Allergies are the result of a chain reaction that starts in the immune system. Your immune system controls how your  body defends itself. For instance, if you have an allergy to pollen, your immune system identifies pollen as an invader or allergen. Your immune system overreacts by producing antibodies called Immunoglobulin E (IgE). These antibodies travel to cells that release chemicals, causing an allergic reaction.  The concept behind allergy immunotherapy, whether it is received in the form of shots or tablets, is that the immune system can be desensitized to specific allergens that trigger allergy symptoms. Although it requires time and patience, the payback can be long-term relief. Allergy injections contain a dilute solution of those substances that you are allergic to based upon your skin testing and allergy history.   How Do Allergy Shots Work?  Allergy shots work much like a vaccine. Your body responds to injected amounts of a particular allergen given in increasing doses, eventually developing a resistance and tolerance to it. Allergy shots can lead to decreased, minimal or no allergy symptoms.  There generally are two phases: build-up and maintenance. Build-up often ranges from three to six months and involves receiving injections with increasing amounts of the allergens. The shots are typically given once or twice a week, though more rapid build-up schedules are sometimes used.  The maintenance phase begins when the most effective dose is reached. This dose is different for each person, depending on how allergic you are and your response to the build-up injections. Once the maintenance dose is reached, there are longer periods between injections, typically two to four weeks.  Occasionally doctors give cortisone-type shots that can temporarily reduce allergy symptoms. These types of shots are different and should not be confused with allergy immunotherapy shots.  Who Can Be Treated with Allergy Shots?  Allergy shots may be a good treatment approach for people with allergic rhinitis (hay fever), allergic  asthma, conjunctivitis (eye allergy) or stinging insect allergy.   Before deciding to begin allergy shots, you should consider:   The length of allergy season and the severity of your symptoms  Whether medications and/or changes to your environment can control your symptoms  Your desire to avoid long-term medication use  Time: allergy immunotherapy requires a major time commitment  Cost: may vary depending on your insurance coverage  Allergy shots for children age 3 and older are effective and often well tolerated. They might prevent the onset of new allergen sensitivities or the progression to asthma.  Allergy shots are not started on patients who are pregnant but can be continued on patients who become pregnant while receiving them. In some patients with other medical conditions or who take certain common medications, allergy shots may be of risk. It is important to mention other medications you talk to your allergist.   What are the two types of build-ups offered:   RUSH or Rapid Desensitization -- one day of injections lasting from 8:30-4:30pm, injections every 1 hour.  Approximately half of the build-up process is completed in that one day.  The following week, normal build-up is resumed, and this entails ~16 visits either weekly or twice weekly, until reaching your "maintenance dose" which is continued weekly until eventually getting spaced out to every month for a duration of 3 to 5 years. The regular build-up appointments are nurse visits where the injections are administered, followed by required monitoring for 30 minutes.    Traditional build-up -- weekly visits for 6 -12 months until reaching "maintenance dose", then continue weekly until eventually spacing out to every 4 weeks as above. At these appointments, the injections are administered, followed by required monitoring for 30 minutes.     Either way is acceptable, and both are equally effective. With the rush protocol, the  advantage is that less time is spent here for injections overall AND you would also reach maintenance dosing faster (which is when the clinical benefit starts to become more apparent). Not everyone is a candidate for rapid desensitization.   IF we proceed with the RUSH protocol, there are premedications which must be taken the day before and the day after the rush only (this includes antihistamines, steroids, and Singulair).  After the rush day, no prednisone or Singulair is required, and we just recommend antihistamines taken on your injection day.  What Is An Estimate of the Costs?  If you are interested in starting allergy injections, please check with your insurance company about your coverage for both allergy vial sets and allergy injections.  Please do so prior to making the appointment to start injections.  The following are CPT codes to give to your insurance company. These are the amounts we BILL to the insurance company, but the amount YOU WILL PAY and WE RECEIVE IS SUBSTANTIALLY LESS and depends on the contracts we have with different insurance companies.   Amount Billed to Insurance  Two allergy vial set  CPT 95165   $ 2400      Two injections   CPT 95117   $ 40  RUSH (Rapid Desensitization) CPT 95180 x 6 hours  $500/hour  Regarding the allergy injections, your co-pay may or may not apply with each injection, so please confirm this with your insurance company. When you start allergy injections, 1 or 2 sets of vials are made based on your  allergies.  Not all patients can be on one set of vials. A set of vials lasts 6 months to a year depending on how quickly you can proceed with your build-up of your allergy injections. Vials are personalized for each patient depending on their specific allergens.  How often are allergy injection given during the build-up period?   Injections are given at least weekly during the build-up period until your maintenance dose is achieved. Per the doctor's  discretion, you may have the option of getting allergy injections two times per week during the build-up period. However, there must be at least 48 hours between injections. The build-up period is usually completed within 6-12 months depending on your ability to schedule injections and for adjustments for reactions. When maintenance dose is reached, your injection schedule is gradually changed to every two weeks and later to every three weeks. Injections will then continue every 4 weeks. Usually, injections are continued for a total of 3-5 years.   When Will I Feel Better?  Some may experience decreased allergy symptoms during the build-up phase. For others, it may take as long as 12 months on the maintenance dose. If there is no improvement after a year of maintenance, your allergist will discuss other treatment options with you.  If you aren't responding to allergy shots, it may be because there is not enough dose of the allergen in your vaccine or there are missing allergens that were not identified during your allergy testing. Other reasons could be that there are high levels of the allergen in your environment or major exposure to non-allergic triggers like tobacco smoke.  What Is the Length of Treatment?  Once the maintenance dose is reached, allergy shots are generally continued for three to five years. The decision to stop should be discussed with your allergist at that time. Some people may experience a permanent reduction of allergy symptoms. Others may relapse and a longer course of allergy shots can be considered.  What Are the Possible Reactions?  The two types of adverse reactions that can occur with allergy shots are local and systemic. Common local reactions include very mild redness and swelling at the injection site, which can happen immediately or several hours after. Report a delayed reaction from your last injection. These include arm swelling or runny nose, watery eyes or cough  that occurs within 12-24 hours after injection. A systemic reaction, which is less common, affects the entire body or a particular body system. They are usually mild and typically respond quickly to medications. Signs include increased allergy symptoms such as sneezing, a stuffy nose or hives.   Rarely, a serious systemic reaction called anaphylaxis can develop. Symptoms include swelling in the throat, wheezing, a feeling of tightness in the chest, nausea or dizziness. Most serious systemic reactions develop within 30 minutes of allergy shots. This is why it is strongly recommended you wait in your doctor's office for 30 minutes after your injections. Your allergist is trained to watch for reactions, and his or her staff is trained and equipped with the proper medications to identify and treat them.   Report to the nurse immediately if you experience any of the following symptoms: swelling, itching or redness of the skin, hives, watery eyes/nose, breathing difficulty, excessive sneezing, coughing, stomach pain, diarrhea, or light headedness. These symptoms may occur within 15-20 minutes after injection and may require medication.   Who Should Administer Allergy Shots?  The preferred location for receiving shots is your prescribing allergist's office.  Injections can sometimes be given at another facility where the physician and staff are trained to recognize and treat reactions, and have received instructions by your prescribing allergist.  What if I am late for an injection?   Injection dose will be adjusted depending upon how many days or weeks you are late for your injection.   What if I am sick?   Please report any illness to the nurse before receiving injections. She may adjust your dose or postpone injections depending on your symptoms. If you have fever, flu, sinus infection or chest congestion it is best to postpone allergy injections until you are better. Never get an allergy injection if  your asthma is causing you problems. If your symptoms persist, seek out medical care to get your health problem under control.  What If I am or Become Pregnant:  Women that become pregnant should schedule an appointment with [facility] before receiving any further allergy injections.

## 2023-02-13 NOTE — Progress Notes (Signed)
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 Seasonal and perennial allergic rhinitis - We are going to try to switch to Ryaltris.  - Start Ryaltris one spray per nostril up to twice daily. - This should be $39 and will be delivered from [facility] in [place].   2. Return in about 1 year (around 02/13/2024).    Subjective:   Preston Simmons is a 42 y.o. male presenting today for follow up of  Chief Complaint  Patient presents with   Allergic Rhinitis    Follow-up    Preston Simmons has a history of the following: Patient Active Problem List   Diagnosis Date Noted   Abnormal weight gain 10/22/2021   Gastroesophageal reflux disease 07/09/2021   Obesity (BMI 30-39.9) 07/09/2021   H/O repair of left rotator cuff 11/13/2018   Nontraumatic complete tear of left rotator cuff 10/10/2018   Seasonal and perennial allergic rhinitis 05/19/2018   Lactose intolerance 05/19/2018    History obtained from: chart review and patient.  Discussed the use of AI scribe software for clinical note transcription with the patient and/or guardian, who gave verbal consent to proceed.  Preston Simmons is a 42 y.o. male presenting for a follow up visit.  Preston Simmons was last seen in August 2023.  At that time, we continue with Xhance 1 spray per nostril daily as well as olopatadine.  We did talk about allergy shots.  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 Preston Simmons reports using Xhance nasal spray approximately three times a week, depending on exposure to allergens. However, Preston Simmons has run out of his current supply and is aware of the worsening  symptoms that occur when not using the medication. Preston Simmons has been able to manage the cost of Xhance through discounts from his insurance and other sources, but is open to alternatives.  The patient also reports a history of sinus infections, with one occurrence since his last visit, which was managed with antibiotics and a steroid. Preston Simmons notes an improvement in frequency, from two to three times a year to once a year, since starting treatment at this clinic. Preston Simmons also mentions using a steroid nasal spray, possibly olopatadine, when Preston Simmons feels a sinus infection coming on.  In addition to his allergies, the patient has been managing his weight with VZDGLO. Preston Simmons has reached his target weight and has reduced the frequency of the medication to once a month. Preston Simmons notes that the longer Preston Simmons is off the medication, the worse the side effects, primarily nausea, become.  The patient was previously on immunotherapy shots, receiving two shots per visit. However, this was interrupted by the onset of the COVID-19 pandemic. Preston Simmons expresses interest in resuming this treatment, possibly at his workplace where a PA is available. Preston Simmons is interested in getting the information including CPT codes to get allergy shots back on board.      Preston Simmons continues to work for [employer].  Preston Simmons has really enjoyed this job.  They seem to treat him very well. Preston Simmons has been there for nearly 10 years in total. Although Preston Simmons is always on the lookout for something else, Preston Simmons is continuing with  it for the meantime.   Otherwise, there have been no changes to his past medical history, surgical history, family history, or social history.    Review of systems otherwise negative other than that mentioned in the HPI.    Objective:   Blood pressure 100/70, pulse 82, temperature 98 F (36.7 C), temperature source Temporal, weight 221 lb 4.8 oz (100.4 kg), SpO2 99%. Body mass index is 29.2 kg/m.    Physical Exam Vitals reviewed.  Constitutional:      Appearance: Preston Simmons is  well-developed.  HENT:     Head: Normocephalic and atraumatic.     Right Ear: Tympanic membrane, ear canal and external ear normal.     Left Ear: Tympanic membrane, ear canal and external ear normal.     Nose: No nasal deformity, septal deviation, nasal tenderness, mucosal edema or rhinorrhea.     Right Turbinates: Not enlarged, swollen or pale.     Left Turbinates: Not enlarged, swollen or pale.     Right Sinus: No maxillary sinus tenderness or frontal sinus tenderness.     Left Sinus: No maxillary sinus tenderness or frontal sinus tenderness.     Mouth/Throat:     Mouth: Mucous membranes are not pale and not dry.     Pharynx: Uvula midline.  Eyes:     General: Lids are normal. No allergic shiner.       Right eye: No discharge.        Left eye: No discharge.     Conjunctiva/sclera: Conjunctivae normal.     Right eye: Right conjunctiva is not injected. No chemosis.    Left eye: Left conjunctiva is not injected. No chemosis.    Pupils: Pupils are equal, round, and reactive to light.  Cardiovascular:     Rate and Rhythm: Normal rate and regular rhythm.     Heart sounds: Normal heart sounds.  Pulmonary:     Effort: Pulmonary effort is normal. No tachypnea, accessory muscle usage or respiratory distress.     Breath sounds: Normal breath sounds. No wheezing, rhonchi or rales.  Chest:     Chest wall: No tenderness.  Lymphadenopathy:     Cervical: No cervical adenopathy.  Skin:    Coloration: Skin is not pale.     Findings: No abrasion, erythema, petechiae or rash. Rash is not papular, urticarial or vesicular.  Neurological:     Mental Status: Preston Simmons is alert.  Psychiatric:        Behavior: Behavior is cooperative.      Diagnostic studies: none    Malachi Bonds, MD  [facility]

## 2023-03-04 DIAGNOSIS — G8929 Other chronic pain: Secondary | ICD-10-CM | POA: Diagnosis not present

## 2023-03-04 DIAGNOSIS — M25511 Pain in right shoulder: Secondary | ICD-10-CM | POA: Diagnosis not present

## 2023-03-04 DIAGNOSIS — M7521 Bicipital tendinitis, right shoulder: Secondary | ICD-10-CM | POA: Diagnosis not present

## 2023-04-28 DIAGNOSIS — J019 Acute sinusitis, unspecified: Secondary | ICD-10-CM | POA: Diagnosis not present

## 2023-05-09 DIAGNOSIS — M7521 Bicipital tendinitis, right shoulder: Secondary | ICD-10-CM | POA: Diagnosis not present

## 2023-05-23 DIAGNOSIS — M7541 Impingement syndrome of right shoulder: Secondary | ICD-10-CM | POA: Diagnosis not present

## 2023-05-23 DIAGNOSIS — Z888 Allergy status to other drugs, medicaments and biological substances status: Secondary | ICD-10-CM | POA: Diagnosis not present

## 2023-05-23 DIAGNOSIS — S43431A Superior glenoid labrum lesion of right shoulder, initial encounter: Secondary | ICD-10-CM | POA: Diagnosis not present

## 2023-05-23 DIAGNOSIS — M7551 Bursitis of right shoulder: Secondary | ICD-10-CM | POA: Diagnosis not present

## 2023-05-23 DIAGNOSIS — M7521 Bicipital tendinitis, right shoulder: Secondary | ICD-10-CM | POA: Diagnosis not present

## 2023-05-23 DIAGNOSIS — Z7985 Long-term (current) use of injectable non-insulin antidiabetic drugs: Secondary | ICD-10-CM | POA: Diagnosis not present

## 2023-05-23 DIAGNOSIS — Z79899 Other long term (current) drug therapy: Secondary | ICD-10-CM | POA: Diagnosis not present

## 2023-05-23 DIAGNOSIS — G8918 Other acute postprocedural pain: Secondary | ICD-10-CM | POA: Diagnosis not present

## 2023-05-23 DIAGNOSIS — M778 Other enthesopathies, not elsewhere classified: Secondary | ICD-10-CM | POA: Diagnosis not present

## 2023-05-23 DIAGNOSIS — Z7982 Long term (current) use of aspirin: Secondary | ICD-10-CM | POA: Diagnosis not present

## 2023-05-23 HISTORY — PX: OTHER SURGICAL HISTORY: SHX169

## 2023-05-28 DIAGNOSIS — M7521 Bicipital tendinitis, right shoulder: Secondary | ICD-10-CM | POA: Diagnosis not present

## 2023-06-02 DIAGNOSIS — M25511 Pain in right shoulder: Secondary | ICD-10-CM | POA: Diagnosis not present

## 2023-06-03 DIAGNOSIS — M7521 Bicipital tendinitis, right shoulder: Secondary | ICD-10-CM | POA: Diagnosis not present

## 2023-06-05 DIAGNOSIS — M7521 Bicipital tendinitis, right shoulder: Secondary | ICD-10-CM | POA: Diagnosis not present

## 2023-06-09 DIAGNOSIS — M7521 Bicipital tendinitis, right shoulder: Secondary | ICD-10-CM | POA: Diagnosis not present

## 2023-06-12 DIAGNOSIS — M7521 Bicipital tendinitis, right shoulder: Secondary | ICD-10-CM | POA: Diagnosis not present

## 2023-06-16 DIAGNOSIS — M7521 Bicipital tendinitis, right shoulder: Secondary | ICD-10-CM | POA: Diagnosis not present

## 2023-06-18 DIAGNOSIS — M7521 Bicipital tendinitis, right shoulder: Secondary | ICD-10-CM | POA: Diagnosis not present

## 2023-06-23 DIAGNOSIS — M25612 Stiffness of left shoulder, not elsewhere classified: Secondary | ICD-10-CM | POA: Diagnosis not present

## 2023-06-23 DIAGNOSIS — M7521 Bicipital tendinitis, right shoulder: Secondary | ICD-10-CM | POA: Diagnosis not present

## 2023-06-25 ENCOUNTER — Ambulatory Visit (INDEPENDENT_AMBULATORY_CARE_PROVIDER_SITE_OTHER): Payer: BC Managed Care – PPO | Admitting: Family

## 2023-06-25 ENCOUNTER — Encounter: Payer: Self-pay | Admitting: Family

## 2023-06-25 VITALS — BP 126/72 | HR 98 | Temp 98.0°F | Ht 73.0 in | Wt 224.8 lb

## 2023-06-25 DIAGNOSIS — Z0000 Encounter for general adult medical examination without abnormal findings: Secondary | ICD-10-CM

## 2023-06-25 DIAGNOSIS — Z13220 Encounter for screening for lipoid disorders: Secondary | ICD-10-CM | POA: Diagnosis not present

## 2023-06-25 DIAGNOSIS — R635 Abnormal weight gain: Secondary | ICD-10-CM

## 2023-06-25 DIAGNOSIS — M7521 Bicipital tendinitis, right shoulder: Secondary | ICD-10-CM | POA: Diagnosis not present

## 2023-06-25 DIAGNOSIS — Z1211 Encounter for screening for malignant neoplasm of colon: Secondary | ICD-10-CM

## 2023-06-25 DIAGNOSIS — M25612 Stiffness of left shoulder, not elsewhere classified: Secondary | ICD-10-CM | POA: Diagnosis not present

## 2023-06-25 MED ORDER — WEGOVY 1.7 MG/0.75ML ~~LOC~~ SOAJ: 1.7000 mg | SUBCUTANEOUS | 1 refills | Status: AC

## 2023-06-25 NOTE — Progress Notes (Signed)
 Phone: [phone]  Subjective:  Patient 43 y.o. male presenting for annual physical.  Chief Complaint  Patient presents with   Annual Exam    Non-fasting; Pt states no concerns needs refill on wegovy. Had a shoulder surgery since last visit.   Discussed the use of AI scribe software for clinical note transcription with the patient, who gave verbal consent to proceed.  History of Present Illness   Preston Simmons is a 43 year old male who presents for a physical after shoulder surgery and wants to discuss weight management. He is recovering from recent shoulder surgery, which involved relocating the bicep due to a tear off the labrum. He experiences limited range of motion and stiffness in the joint, similar to his previous shoulder surgery on the opposite side. He is undergoing physical therapy twice a week and performs exercises at home to improve mobility. He is currently on a break from work to allow for recovery, planning to return gradually. He uses the computer at work, which requires some arm movement, and he is mindful of not overexerting his shoulder.  He discontinued Wegovy prior to surgery and has been off it for about eight weeks. He has noticed some weight gain since stopping the medication, as his appetite has returned. He previously reached a weight of 215 pounds but gained about 10 pounds after developing pneumonia and being less active post-surgery. He would like to resume Wegovy. He has been emptying 1/2 syringe of 2.5mg  doses he had left over, knowing he could not resume full dose right away without SE. He has a family history of colon cancer on his mother's side, which raises his awareness for potential screening needs. He has not had a colonoscopy yet. No history of diabetes or being borderline diabetic.     See problem oriented charting- ROS- full  review of systems was completed and negative except for: what is noted in HPI above.  The following were reviewed and  entered/updated in epic: Past Medical History:  Diagnosis Date   Adverse food reaction 02/21/2020   Allergy    Gastroesophageal reflux disease 07/09/2021   Nontraumatic complete tear of left rotator cuff 10/10/2018   Formatting of this note might be different from the original.   MRI arthrogram left shoulder- 10/02/2018- Small moderate-to-high grade interstitial partial tear of the supraspinatus tendon at the footprint, approximately 8 mm in width, background of moderate supraspinatus tendinopathy and bursal surface fraying, intact labrum.     Patient Active Problem List   Diagnosis Date Noted   Abnormal weight gain 10/22/2021   Gastroesophageal reflux disease 07/09/2021   Obesity (BMI 30-39.9) 07/09/2021   H/O repair of left rotator cuff 11/13/2018   Seasonal and perennial allergic rhinitis 05/19/2018   Lactose intolerance 05/19/2018   Past Surgical History:  Procedure Laterality Date   right shoulder arthroscope Right 05/23/2023   ROTATOR CUFF REPAIR  10/2018    History reviewed. No pertinent family history.  Medications- reviewed and updated Current Outpatient Medications  Medication Sig Dispense Refill   EPINEPHrine (AUVI-Q) 0.3 mg/0.3 mL IJ SOAJ injection Inject 0.3 mLs (0.3 mg total) into the muscle as needed for anaphylaxis. 2 Device 1   ibuprofen (ADVIL,MOTRIN) 600 MG tablet Take 1 tablet (600 mg total) by mouth every 8 (eight) hours as needed for mild pain. 15 tablet 0   Semaglutide-Weight Management (WEGOVY) 1.7 MG/0.75ML SOAJ Inject 1.7 mg into the skin once a week. 3 mL 1   triamcinolone (NASACORT ALLERGY 24HR) 55 MCG/ACT AERO  nasal inhaler Place 2 sprays into the nose daily. 1 each 5   mometasone (NASONEX) 50 MCG/ACT nasal spray Apply two sprays each in each nostril once a day as needed for stuffy nose. (Patient not taking: Reported on 06/25/2023) 1 each 5   Olopatadine HCl 0.6 % SOLN 1-2 sprays per nostril twice a day as needed for runny nose/drainage (Patient not  taking: Reported on 06/25/2023) 30.5 g 5   Olopatadine-Mometasone (RYALTRIS) 665-25 MCG/ACT SUSP Place 2 sprays into the nose 2 (two) times daily as needed. (Patient not taking: Reported on 06/25/2023) 29 g 5   pantoprazole (PROTONIX) 20 MG tablet Take 1 tablet (20 mg total) by mouth daily. (Patient not taking: Reported on 06/25/2023) 90 tablet 1   XHANCE 93 MCG/ACT EXHU Place 2 sprays into both nostrils 2 (two) times daily. (Patient not taking: Reported on 06/25/2023) 16 mL 5   No current facility-administered medications for this visit.    Allergies-reviewed and updated Allergies  Allergen Reactions   Grass Extracts [Gramineae Pollens]     Social History   Social History Narrative   Not on file    Objective:  BP 126/72   Pulse 98   Temp 98 F (36.7 C) (Temporal)   Ht 6\' 1"  (1.854 m)   Wt 224 lb 12.8 oz (102 kg)   SpO2 99%   BMI 29.66 kg/m  Physical Exam Vitals and nursing note reviewed.  Constitutional:      General: He is not in acute distress.    Appearance: Normal appearance.  HENT:     Head: Normocephalic.  Cardiovascular:     Rate and Rhythm: Normal rate and regular rhythm.  Pulmonary:     Effort: Pulmonary effort is normal.     Breath sounds: Normal breath sounds.  Musculoskeletal:     Right shoulder: Tenderness and bony tenderness present. Decreased range of motion.     Cervical back: Normal range of motion.  Skin:    General: Skin is warm and dry.  Neurological:     Mental Status: He is alert and oriented to person, place, and time.  Psychiatric:        Mood and Affect: Mood normal.     Assessment and Plan   Health Maintenance counseling: 1. Anticipatory guidance: Patient counseled regarding regular dental exams q6 months, eye exams yearly, avoiding smoking and second hand smoke, limiting alcohol to 2 beverages per day.   2. Risk factor reduction:  Advised patient of need for regular exercise and diet rich in fruits and vegetables to reduce risk of heart  attack and stroke.    Wt Readings from Last 3 Encounters:  06/25/23 224 lb 12.8 oz (102 kg)  02/13/23 221 lb 4.8 oz (100.4 kg)  08/02/22 222 lb (100.7 kg)   3. Immunizations/screenings/ancillary studies Immunization History  Administered Date(s) Administered   PFIZER(Purple Top)SARS-COV-2 Vaccination 08/12/2019, 09/16/2019   Tdap 10/02/2017   Health Maintenance Due  Topic Date Due   INFLUENZA VACCINE  Never done    4. Skin cancer screening- advised regular sunscreen use. Denies worrisome, changing, or new skin lesions.  5. Smoking associated screening: non- smoker  6. STD screening - N/A 7. Alcohol screening: rare  Assessment and Plan    S/P right shoulder rotator cuff repair -  Post-operative recovery from shoulder surgery with limited range of motion. Patient is actively participating in physical therapy twice a week and is committed to home exercises. -Continue physical therapy and home exercises to improve range of  motion and strength.  Weight Management - Patient had stopped Wegovy prior to surgery and has noticed an increase in appetite and weight. Patient is willing to restart medication. Discussed possible ins denial d/t lower BMI. -Restart Wegovy at 1.7mg  for a month, then increase to 2.4mg  if tolerated after 1 month, pt to let me know via MyChart. -F/U 3 mos  Colon Cancer Screening - Family history of colon cancer (mother). Patient has not had a colonoscopy. -Refer to gastroenterology for colonoscopy.  General Health Maintenance - -Complete lab work today. -Review lab results in a few days and communicate findings to the patient.      Recommended follow up: Return in about 3 months (around 09/25/2023) for Weight loss, med refills. Future Appointments  Date Time Provider Department Center  02/12/2024  4:00 PM Alfonse Spruce, MD [facility] None  06/30/2024  2:30 PM Dulce Sellar, NP [facility]    Lab/Order associations: non- fasting   Dulce Sellar, NP

## 2023-06-25 NOTE — Patient Instructions (Signed)
 It was very nice to see you today!   I will review your lab results via MyChart in a few days.  You look great! Good luck with your continued shoulder recovery.     PLEASE NOTE:  If you had any lab tests please let us know if you have not heard back within a few days. You may see your results on MyChart before we have a chance to review them but we will give you a call once they are reviewed by [place]. If we ordered any referrals today, please let us know if you have not heard from their office within the next week.

## 2023-06-26 ENCOUNTER — Encounter: Payer: Self-pay | Admitting: Family

## 2023-06-26 DIAGNOSIS — R7989 Other specified abnormal findings of blood chemistry: Secondary | ICD-10-CM

## 2023-06-26 LAB — LIPID PANEL
Cholesterol: 196 mg/dL (ref 0–200)
HDL: 49.9 mg/dL (ref >39.00)
LDL Cholesterol: 131 mg/dL — ABNORMAL HIGH (ref 0–99)
NonHDL: 146.54
Total CHOL/HDL Ratio: 4
Triglycerides: 80 mg/dL (ref 0.0–149.0)
VLDL: 16 mg/dL (ref 0.0–40.0)

## 2023-06-26 LAB — CBC WITH DIFFERENTIAL/PLATELET
Basophils Absolute: 0 10*3/uL (ref 0.0–0.1)
Basophils Relative: 0.7 % (ref 0.0–3.0)
Eosinophils Absolute: 0 10*3/uL (ref 0.0–0.7)
Eosinophils Relative: 1 % (ref 0.0–5.0)
HCT: 39.6 % (ref 39.0–52.0)
Hemoglobin: 12.3 g/dL — ABNORMAL LOW (ref 13.0–17.0)
Lymphocytes Relative: 51.1 % — ABNORMAL HIGH (ref 12.0–46.0)
Lymphs Abs: 1.3 10*3/uL (ref 0.7–4.0)
MCHC: 31 g/dL (ref 30.0–36.0)
MCV: 71.7 fl — ABNORMAL LOW (ref 78.0–100.0)
Monocytes Absolute: 0.3 10*3/uL (ref 0.1–1.0)
Monocytes Relative: 10.1 % (ref 3.0–12.0)
Neutro Abs: 1 10*3/uL — ABNORMAL LOW (ref 1.4–7.7)
Neutrophils Relative %: 37.1 % — ABNORMAL LOW (ref 43.0–77.0)
Platelets: 80 10*3/uL — ABNORMAL LOW (ref 150.0–400.0)
RBC: 5.53 Mil/uL (ref 4.22–5.81)
RDW: 15.7 % — ABNORMAL HIGH (ref 11.5–15.5)
WBC: 2.6 10*3/uL — ABNORMAL LOW (ref 4.0–10.5)

## 2023-06-26 LAB — COMPREHENSIVE METABOLIC PANEL
ALT: 23 U/L (ref 0–53)
AST: 28 U/L (ref 0–37)
Albumin: 4.5 g/dL (ref 3.5–5.2)
Alkaline Phosphatase: 52 U/L (ref 39–117)
BUN: 16 mg/dL (ref 6–23)
CO2: 28 meq/L (ref 19–32)
Calcium: 9.6 mg/dL (ref 8.4–10.5)
Chloride: 105 meq/L (ref 96–112)
Creatinine, Ser: 0.98 mg/dL (ref 0.40–1.50)
GFR: 95.99 mL/min (ref >60.00)
Glucose, Bld: 87 mg/dL (ref 70–99)
Potassium: 4.2 meq/L (ref 3.5–5.1)
Sodium: 140 meq/L (ref 135–145)
Total Bilirubin: 0.3 mg/dL (ref 0.2–1.2)
Total Protein: 7.7 g/dL (ref 6.0–8.3)

## 2023-06-26 LAB — TSH: TSH: 1.95 u[IU]/mL (ref 0.35–5.50)

## 2023-06-26 NOTE — Progress Notes (Signed)
 Please see lab result message and call patient before end of day Friday if he has not responded, thanks.

## 2023-06-30 ENCOUNTER — Telehealth: Payer: Self-pay

## 2023-06-30 ENCOUNTER — Other Ambulatory Visit (HOSPITAL_COMMUNITY): Payer: Self-pay

## 2023-06-30 DIAGNOSIS — M7521 Bicipital tendinitis, right shoulder: Secondary | ICD-10-CM | POA: Diagnosis not present

## 2023-06-30 DIAGNOSIS — M25612 Stiffness of left shoulder, not elsewhere classified: Secondary | ICD-10-CM | POA: Diagnosis not present

## 2023-06-30 NOTE — Telephone Encounter (Signed)
 Pharmacy Patient Advocate Encounter   Received notification from Fax that prior authorization for [facility] is required/requested.   Insurance verification completed.   The patient is insured through [employer] .   Per test claim: PA required; PA submitted to above mentioned insurance via Fax Key/confirmation #/[id number] Status is pending   Fax# [phone]

## 2023-07-01 ENCOUNTER — Other Ambulatory Visit (HOSPITAL_BASED_OUTPATIENT_CLINIC_OR_DEPARTMENT_OTHER): Payer: Self-pay

## 2023-07-02 DIAGNOSIS — M25612 Stiffness of left shoulder, not elsewhere classified: Secondary | ICD-10-CM | POA: Diagnosis not present

## 2023-07-02 DIAGNOSIS — M7521 Bicipital tendinitis, right shoulder: Secondary | ICD-10-CM | POA: Diagnosis not present

## 2023-07-04 ENCOUNTER — Encounter: Payer: Self-pay | Admitting: Family

## 2023-07-04 DIAGNOSIS — R7989 Other specified abnormal findings of blood chemistry: Secondary | ICD-10-CM | POA: Insufficient documentation

## 2023-07-07 ENCOUNTER — Other Ambulatory Visit

## 2023-07-07 ENCOUNTER — Ambulatory Visit: Admitting: Family

## 2023-07-07 DIAGNOSIS — R7989 Other specified abnormal findings of blood chemistry: Secondary | ICD-10-CM | POA: Diagnosis not present

## 2023-07-07 DIAGNOSIS — M25612 Stiffness of left shoulder, not elsewhere classified: Secondary | ICD-10-CM | POA: Diagnosis not present

## 2023-07-07 DIAGNOSIS — M7521 Bicipital tendinitis, right shoulder: Secondary | ICD-10-CM | POA: Diagnosis not present

## 2023-07-08 ENCOUNTER — Other Ambulatory Visit (HOSPITAL_COMMUNITY): Payer: Self-pay

## 2023-07-09 DIAGNOSIS — M25612 Stiffness of left shoulder, not elsewhere classified: Secondary | ICD-10-CM | POA: Diagnosis not present

## 2023-07-09 DIAGNOSIS — M7521 Bicipital tendinitis, right shoulder: Secondary | ICD-10-CM | POA: Diagnosis not present

## 2023-07-09 NOTE — Telephone Encounter (Signed)
 Pharmacy Patient Advocate Encounter  Received notification from [employer] that Prior Authorization for [facility]  has been APPROVED from 05/31/23 to 06/29/24   Approval letter indexed to media tab

## 2023-07-09 NOTE — Telephone Encounter (Signed)
 I called pt in regards to approval, pt verbalized understanding.

## 2023-07-10 ENCOUNTER — Other Ambulatory Visit: Payer: Self-pay | Admitting: Family

## 2023-07-10 ENCOUNTER — Encounter: Payer: Self-pay | Admitting: Family

## 2023-07-10 DIAGNOSIS — R7989 Other specified abnormal findings of blood chemistry: Secondary | ICD-10-CM

## 2023-07-10 LAB — CBC WITH DIFFERENTIAL/PLATELET
Absolute Lymphocytes: 1615 {cells}/uL (ref 850–3900)
Absolute Monocytes: 313 {cells}/uL (ref 200–950)
Basophils Absolute: 9 {cells}/uL (ref 0–200)
Basophils Relative: 0.3 %
Eosinophils Absolute: 29 {cells}/uL (ref 15–500)
Eosinophils Relative: 1 %
HCT: 37.6 % — ABNORMAL LOW (ref 38.5–50.0)
Hemoglobin: 11.9 g/dL — ABNORMAL LOW (ref 13.2–17.1)
MCH: 22.6 pg — ABNORMAL LOW (ref 27.0–33.0)
MCHC: 31.6 g/dL — ABNORMAL LOW (ref 32.0–36.0)
MCV: 71.3 fL — ABNORMAL LOW (ref 80.0–100.0)
Monocytes Relative: 10.8 %
Neutro Abs: 934 {cells}/uL — ABNORMAL LOW (ref 1500–7800)
Neutrophils Relative %: 32.2 %
Platelets: 62 10*3/uL — ABNORMAL LOW (ref 140–400)
RBC: 5.27 10*6/uL (ref 4.20–5.80)
RDW: 16.3 % — ABNORMAL HIGH (ref 11.0–15.0)
Total Lymphocyte: 55.7 %
WBC: 2.9 10*3/uL — ABNORMAL LOW (ref 3.8–10.8)

## 2023-07-10 LAB — HEMOGLOBINOPATHY EVALUATION
Fetal Hemoglobin Testing: <1 % (ref 0.0–1.9)
HCT: 39.6 % (ref 38.5–50.0)
Hemoglobin A2 - HGBRFX: 2.2 % (ref 2.0–3.2)
Hemoglobin: 12.1 g/dL — ABNORMAL LOW (ref 13.2–17.1)
Hgb A: 97.8 % (ref >96.0)
MCH: 22.4 pg — ABNORMAL LOW (ref 27.0–33.0)
MCV: 73.2 fL — ABNORMAL LOW (ref 80.0–100.0)
RBC: 5.41 10*6/uL (ref 4.20–5.80)
RDW: 16.7 % — ABNORMAL HIGH (ref 11.0–15.0)

## 2023-07-10 LAB — IRON,TIBC AND FERRITIN PANEL
%SAT: 29 % (ref 20–48)
Ferritin: 153 ng/mL (ref 38–380)
Iron: 74 ug/dL (ref 50–180)
TIBC: 259 ug/dL (ref 250–425)

## 2023-07-11 ENCOUNTER — Other Ambulatory Visit: Payer: Self-pay | Admitting: Oncology

## 2023-07-11 DIAGNOSIS — D61818 Other pancytopenia: Secondary | ICD-10-CM

## 2023-07-14 ENCOUNTER — Inpatient Hospital Stay: Attending: Oncology | Admitting: Oncology

## 2023-07-14 ENCOUNTER — Inpatient Hospital Stay

## 2023-07-14 ENCOUNTER — Encounter: Payer: Self-pay | Admitting: Oncology

## 2023-07-14 VITALS — BP 121/81 | HR 92 | Temp 98.2°F | Resp 16 | Ht 73.0 in | Wt 227.7 lb

## 2023-07-14 DIAGNOSIS — D61818 Other pancytopenia: Secondary | ICD-10-CM

## 2023-07-14 HISTORY — DX: Other pancytopenia: D61.818

## 2023-07-14 LAB — CBC WITH DIFFERENTIAL (CANCER CENTER ONLY)
Abs Immature Granulocytes: 0.01 10*3/uL (ref 0.00–0.07)
Basophils Absolute: 0 10*3/uL (ref 0.0–0.1)
Basophils Relative: 0 %
Eosinophils Absolute: 0 10*3/uL (ref 0.0–0.5)
Eosinophils Relative: 1 %
HCT: 39 % (ref 39.0–52.0)
Hemoglobin: 12 g/dL — ABNORMAL LOW (ref 13.0–17.0)
Immature Granulocytes: 0 %
Lymphocytes Relative: 55 %
Lymphs Abs: 1.6 10*3/uL (ref 0.7–4.0)
MCH: 22.5 pg — ABNORMAL LOW (ref 26.0–34.0)
MCHC: 30.8 g/dL (ref 30.0–36.0)
MCV: 73 fL — ABNORMAL LOW (ref 80.0–100.0)
Monocytes Absolute: 0.3 10*3/uL (ref 0.1–1.0)
Monocytes Relative: 10 %
Neutro Abs: 1 10*3/uL — ABNORMAL LOW (ref 1.7–7.7)
Neutrophils Relative %: 34 %
Platelet Count: 76 10*3/uL — ABNORMAL LOW (ref 150–400)
RBC: 5.34 MIL/uL (ref 4.22–5.81)
RDW: 15.9 % — ABNORMAL HIGH (ref 11.5–15.5)
WBC Count: 2.9 10*3/uL — ABNORMAL LOW (ref 4.0–10.5)
nRBC: 0 % (ref 0.0–0.2)

## 2023-07-14 LAB — RETICULOCYTES
Immature Retic Fract: 13.5 % (ref 2.3–15.9)
RBC.: 5.24 MIL/uL (ref 4.22–5.81)
Retic Count, Absolute: 39.8 10*3/uL (ref 19.0–186.0)
Retic Ct Pct: 0.8 % (ref 0.4–3.1)

## 2023-07-14 LAB — CMP (CANCER CENTER ONLY)
ALT: 20 U/L (ref 0–44)
AST: 21 U/L (ref 15–41)
Albumin: 4.6 g/dL (ref 3.5–5.0)
Alkaline Phosphatase: 56 U/L (ref 38–126)
Anion gap: 9 (ref 5–15)
BUN: 18 mg/dL (ref 6–20)
CO2: 28 mmol/L (ref 22–32)
Calcium: 9.4 mg/dL (ref 8.9–10.3)
Chloride: 104 mmol/L (ref 98–111)
Creatinine: 1.11 mg/dL (ref 0.61–1.24)
GFR, Estimated: >60 mL/min (ref >60)
Glucose, Bld: 90 mg/dL (ref 70–99)
Potassium: 3.9 mmol/L (ref 3.5–5.1)
Sodium: 141 mmol/L (ref 135–145)
Total Bilirubin: 0.3 mg/dL (ref 0.0–1.2)
Total Protein: 7.5 g/dL (ref 6.5–8.1)

## 2023-07-14 LAB — FOLATE: Folate: 9.2 ng/mL (ref >5.9)

## 2023-07-14 LAB — SAVE SMEAR(SSMR), FOR PROVIDER SLIDE REVIEW

## 2023-07-14 LAB — APTT: aPTT: 23 s — ABNORMAL LOW (ref 24–36)

## 2023-07-14 LAB — DIRECT ANTIGLOBULIN TEST (NOT AT ARMC)
DAT, IgG: NEGATIVE
DAT, complement: NEGATIVE

## 2023-07-14 LAB — LACTATE DEHYDROGENASE: LDH: 159 U/L (ref 98–192)

## 2023-07-14 LAB — VITAMIN B12: Vitamin B-12: 358 pg/mL (ref 180–914)

## 2023-07-14 LAB — PROTIME-INR
INR: 0.9 (ref 0.8–1.2)
Prothrombin Time: 11.8 s (ref 11.4–15.2)

## 2023-07-14 LAB — FIBRINOGEN: Fibrinogen: 362 mg/dL (ref 210–475)

## 2023-07-14 NOTE — Progress Notes (Signed)
 [facility]  HEMATOLOGY CLINIC CONSULTATION NOTE   PATIENT NAME: Preston Simmons   MR#: [id number] DOB: March 04, 1981  DATE OF SERVICE: 07/14/2023   REFERRING PHYSICIAN  Preston Sellar, NP   Patient Care Team: Preston Sellar, NP as PCP - General (Family Medicine)   REASON FOR CONSULTATION/ CHIEF COMPLAINT:  Pancytopenia  ASSESSMENT & PLAN:  Preston Simmons is a 43 y.o. gentleman with a past medical history of GERD, past-obesity, tear of left rotator cuff, was referred to our service for evaluation of pancytopenia.    Other pancytopenia (HCC) Chronic pancytopenia since age 34 apparently.   Labs today showed evidence of leukopenia (WBC 2,900), anemia (hemoglobin 12.0), and thrombocytopenia (platelets 76,000).   Peripheral blood smear shows no immature or concerning cells.   Coagulation parameters are normal.  B12, folate are normal.  Recent iron studies and TSH were also within normal limits.  Hemoglobin electrophoresis showed no hemoglobinopathies.  Differential diagnosis includes benign constitutional neutropenia, autoimmune conditions, or hypersplenism. He exhibits no symptoms of recurrent infections, unusual bleeding, or significant weight loss. No significant alcohol or drug use, and no exposure to high-risk viral infections.   Previous bone marrow biopsy was unremarkable per patient's verbal report, when he was 43 years of age.   If platelets fall below 50,000, bleeding risk increases, and below 10,000, spontaneous bleeding can occur. Neutrophils are at a safe level, but if they fall below 500, infection risk increases. Bone marrow biopsy may be reconsidered if counts trend downward.  - Order abdominal ultrasound to assess spleen size.  - Repeat blood counts in one month.  - Call next week to discuss pending test results.  - Avoid aspirin, Aleve, Advil, and ibuprofen to minimize bleeding risk.  - Advise on infection prevention measures,  including avoiding sick individuals and practicing good hand hygiene.  Given the chronicity of pancytopenia and asymptomatic status, it does not seem like he has acute bone marrow pathology.  Patient was provided reassurance.   I reviewed lab results and outside records for this visit and discussed relevant results with the patient. Diagnosis, plan of care and treatment options were also discussed in detail with the patient. Opportunity provided to ask questions and answers provided to his apparent satisfaction. Provided instructions to call our clinic with any problems, questions or concerns prior to return visit. I recommended to continue follow-up with PCP and sub-specialists. He verbalized understanding and agreed with the plan. No barriers to learning was detected.  Preston Simmons  07/14/2023 4:25 PM  [facility] - A DEPT OF [facility] [address] [place] [address] Dept: [phone] Dept Fax: [phone]   HISTORY OF PRESENT ILLNESS:  Discussed the use of AI scribe software for clinical note transcription with the patient, who gave verbal consent to proceed.   On his annual physical visit with his PCP on 06/25/2023, routine labs were obtained.  CBC showed white count of 2600 with ANC of 1000, ALC of 1300, normal differential.  Hemoglobin was 12.3, MCV 71.7.  Platelet count was decreased at 80,000.  CBC was repeated on 07/07/2023.  White count was slightly better but still low at 2900, ANC 934, otherwise normal differential.  Hemoglobin 12.1, MCV 73.2, platelet count 62,000.  Iron studies showed no evidence of iron deficiency.  Hemoglobin electrophoresis showed unremarkable pattern.  Given persistent pancytopenia, referral was sent to [place] for further evaluation.  He has a history of  low blood counts since his teenage years. He reports that when he was 43 years old, he had a low blood count and was advised to  avoid high contact sports, but no specific diagnosis or treatment was given.  He apparently had extensive workup including bone marrow biopsy at [facility] at that time.   He denies any symptoms of infection such as fevers, chills, or night sweats. He also denies any bleeding or bruising issues. The patient has a history of bicep tendon and rotator cuff surgeries, but he denies any prolonged bleeding or healing issues related to these surgeries. 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 The patient's sister has a history of anemia and low iron.   MEDICAL HISTORY Past Medical History:  Diagnosis Date   Adverse food reaction 02/21/2020   Allergy    Gastroesophageal reflux disease 07/09/2021   Nontraumatic complete tear of left rotator cuff 10/10/2018   Formatting of this note might be different from the original.   MRI arthrogram left shoulder- 10/02/2018- Small moderate-to-high grade interstitial partial tear of the supraspinatus tendon at the footprint, approximately 8 mm in width, background of moderate supraspinatus tendinopathy and bursal surface fraying, intact labrum.     Other pancytopenia (HCC) 07/14/2023     SURGICAL HISTORY Past Surgical History:  Procedure Laterality Date   right shoulder arthroscope Right 05/23/2023   ROTATOR CUFF REPAIR  10/2018     SOCIAL HISTORY: He reports that he has never smoked. He has never used smokeless tobacco. He reports current alcohol use of about 1.0 standard drink of alcohol per week. He reports that he does not use drugs. Social History   Socioeconomic History   Marital status: Single    Spouse name: Not on file   Number of children: Not on file   Years of education: Not on file   Highest education level: Bachelor's degree (e.g., BA, AB, BS)  Occupational History   Not on file  Tobacco Use   Smoking status: Never   Smokeless tobacco: Never  Vaping  Use   Vaping status: Never Used  Substance and Sexual Activity   Alcohol use: Yes    Alcohol/week: 1.0 standard drink of alcohol    Types: 1 Cans of beer per week    Comment: social   Drug use: Never   Sexual activity: Yes    Birth control/protection: None  Other Topics Concern   Not on file  Social History Narrative   Not on file   Social Drivers of Health   Financial Resource Strain: Low Risk  (07/11/2023)   Received from [facility]   Overall Financial Resource Strain (CARDIA)    Difficulty of Paying Living Expenses: Not hard at all  Food Insecurity: No Food Insecurity (07/14/2023)   Hunger Vital Sign    Worried About Running Out of Food in the Last Year: Never true    Ran Out of Food in the Last Year: Never true  Transportation Needs: No Transportation Needs (07/14/2023)   PRAPARE - Administrator, Civil Service (Medical): No    Lack of Transportation (Non-Medical): No  Physical Activity: Insufficiently Active (07/11/2023)   Received from [facility]   Exercise Vital Sign    Days of Exercise per Week: 3 days    Minutes of Exercise per Session: 20 min  Stress: No Stress Concern Present (07/11/2023)   Received from [facility]  [employer] of Occupational Health - Occupational Stress Questionnaire    Feeling of Stress : Only a little  Social Connections: Socially Integrated (07/11/2023)   Received from [facility]   Social Network    How would you rate your social network (family, work, friends)?: Good participation with social networks  Intimate Partner Violence: Not At Risk (07/14/2023)   Humiliation, Afraid, Rape, and Kick questionnaire    Fear of Current or Ex-Partner: No    Emotionally Abused: No    Physically Abused: No    Sexually Abused: No    FAMILY HISTORY: His family history is not on file.  CURRENT MEDICATIONS   Current Outpatient Medications  Medication Instructions   ibuprofen (ADVIL) 600 mg, Oral, Every 8 hours PRN    mometasone (NASONEX) 50 MCG/ACT nasal spray Apply two sprays each in each nostril once a day as needed for stuffy nose.   Olopatadine HCl 0.6 % SOLN 1-2 sprays per nostril twice a day as needed for runny nose/drainage   Olopatadine-Mometasone (RYALTRIS) 665-25 MCG/ACT SUSP 2 sprays, Nasal, 2 times daily PRN   pantoprazole (PROTONIX) 20 mg, Oral, Daily   Wegovy 1.7 mg, Subcutaneous, Weekly     ALLERGIES  He is allergic to grass extracts [gramineae pollens].  REVIEW OF SYSTEMS:  Review of Systems - Oncology   Rest of the pertinent review of systems is unremarkable except as mentioned above in HPI.  PHYSICAL EXAMINATION:    Onc Performance Status - 07/14/23 1114       ECOG Perf Status   ECOG Perf Status Fully active, able to carry on all pre-disease performance without restriction      KPS SCALE   KPS % SCORE Normal, no compliants, no evidence of disease             Vitals:   07/14/23 1105  BP: 121/81  Pulse: 92  Resp: 16  Temp: 98.2 F (36.8 C)  SpO2: 100%   Filed Weights   07/14/23 1105  Weight: 227 lb 11.2 oz (103.3 kg)    Physical Exam Constitutional:      General: He is not in acute distress.    Appearance: Normal appearance.  HENT:     Head: Normocephalic and atraumatic.  Eyes:     General: No scleral icterus.    Conjunctiva/sclera: Conjunctivae normal.  Cardiovascular:     Rate and Rhythm: Normal rate and regular rhythm.     Heart sounds: Normal heart sounds.  Pulmonary:     Effort: Pulmonary effort is normal.     Breath sounds: Normal breath sounds.  Abdominal:     General: There is no distension.     Palpations: Abdomen is soft. There is no mass.  Musculoskeletal:     Right lower leg: No edema.     Left lower leg: No edema.  Neurological:     General: No focal deficit present.     Mental Status: He is alert and oriented to person, place, and time.  Psychiatric:        Mood and Affect: Mood normal.        Behavior: Behavior normal.         Thought Content: Thought content normal.      LABORATORY DATA:   I have reviewed the data as listed.  Results for orders placed or performed in visit on 07/14/23  Fibrinogen  Result Value Ref Range   Fibrinogen 362 210 - 475 mg/dL  APTT  Result Value Ref Range  aPTT 23 (L) 24 - 36 seconds  Protime-INR  Result Value Ref Range   Prothrombin Time 11.8 11.4 - 15.2 seconds   INR 0.9 0.8 - 1.2  Save Smear for Provider Slide Review  Result Value Ref Range   Smear Review SMEAR STAINED AND AVAILABLE FOR REVIEW   Reticulocytes  Result Value Ref Range   Retic Ct Pct 0.8 0.4 - 3.1 %   RBC. 5.24 4.22 - 5.81 MIL/uL   Retic Count, Absolute 39.8 19.0 - 186.0 K/uL   Immature Retic Fract 13.5 2.3 - 15.9 %  Folate  Result Value Ref Range   Folate 9.2 >5.9 ng/mL  Vitamin B12  Result Value Ref Range   Vitamin B-12 358 180 - 914 pg/mL  Lactate dehydrogenase  Result Value Ref Range   LDH 159 98 - 192 U/L  CMP (Cancer Center only)  Result Value Ref Range   Sodium 141 135 - 145 mmol/L   Potassium 3.9 3.5 - 5.1 mmol/L   Chloride 104 98 - 111 mmol/L   CO2 28 22 - 32 mmol/L   Glucose, Bld 90 70 - 99 mg/dL   BUN 18 6 - 20 mg/dL   Creatinine 4.74 2.59 - 1.24 mg/dL   Calcium 9.4 8.9 - 56.3 mg/dL   Total Protein 7.5 6.5 - 8.1 g/dL   Albumin 4.6 3.5 - 5.0 g/dL   AST 21 15 - 41 U/L   ALT 20 0 - 44 U/L   Alkaline Phosphatase 56 38 - 126 U/L   Total Bilirubin 0.3 0.0 - 1.2 mg/dL   GFR, Estimated >87 >56 mL/min   Anion gap 9 5 - 15  CBC with Differential (Cancer Center Only)  Result Value Ref Range   WBC Count 2.9 (L) 4.0 - 10.5 K/uL   RBC 5.34 4.22 - 5.81 MIL/uL   Hemoglobin 12.0 (L) 13.0 - 17.0 g/dL   HCT 43.3 29.5 - 18.8 %   MCV 73.0 (L) 80.0 - 100.0 fL   MCH 22.5 (L) 26.0 - 34.0 pg   MCHC 30.8 30.0 - 36.0 g/dL   RDW 41.6 (H) 60.6 - 30.1 %   Platelet Count 76 (L) 150 - 400 K/uL   nRBC 0.0 0.0 - 0.2 %   Neutrophils Relative % 34 %   Neutro Abs 1.0 (L) 1.7 - 7.7 K/uL   Lymphocytes  Relative 55 %   Lymphs Abs 1.6 0.7 - 4.0 K/uL   Monocytes Relative 10 %   Monocytes Absolute 0.3 0.1 - 1.0 K/uL   Eosinophils Relative 1 %   Eosinophils Absolute 0.0 0.0 - 0.5 K/uL   Basophils Relative 0 %   Basophils Absolute 0.0 0.0 - 0.1 K/uL   Immature Granulocytes 0 %   Abs Immature Granulocytes 0.01 0.00 - 0.07 K/uL  Direct antiglobulin test (not at [facility])  Result Value Ref Range   DAT, complement NEG    DAT, IgG      NEG Performed at [facility], [address] [place], [place] [address]     RADIOGRAPHIC STUDIES:  No pertinent imaging studies available to review.  Orders Placed This Encounter  Procedures   US Abdomen Limited    Standing Status:   Future    Expected Date:   07/21/2023    Expiration Date:   07/13/2024    Reason for Exam (SYMPTOM  OR DIAGNOSIS REQUIRED):   Pancytopenia. Please evaluate for any splenomegaly or liver abnormalities    Preferred imaging location?:  [facility]    Future Appointments  Date Time Provider Department Center  07/21/2023 10:30 AM [facility] None  08/11/2023  2:30 PM [facility] PHLEBOTOMIST [facility] None  08/11/2023  3:00 PM Kitt Minardi, Archie Patten, [facility] None  02/12/2024  4:00 PM Alfonse Spruce, [facility] None  06/30/2024  2:30 PM Preston Sellar, NP [facility]    I spent a total of 55 minutes during this encounter with the patient including review of chart and various tests results, discussions about plan of care and coordination of care plan.  This document was completed utilizing speech recognition software. Grammatical errors, random word insertions, pronoun errors, and incomplete sentences are an occasional consequence of this Simmons due to software limitations, ambient noise, and hardware issues. Any formal questions or concerns about the content, text or information contained within the body of this dictation should be directly addressed to the provider for clarification.

## 2023-07-14 NOTE — Assessment & Plan Note (Signed)
 Chronic pancytopenia since age 43 apparently.   Labs today showed evidence of leukopenia (WBC 2,900), anemia (hemoglobin 12.0), and thrombocytopenia (platelets 76,000).   Peripheral blood smear shows no immature or concerning cells.   Coagulation parameters are normal.  B12, folate are normal.  Recent iron studies and TSH were also within normal limits.  Hemoglobin electrophoresis showed no hemoglobinopathies.  Differential diagnosis includes benign constitutional neutropenia, autoimmune conditions, or hypersplenism. He exhibits no symptoms of recurrent infections, unusual bleeding, or significant weight loss. No significant alcohol or drug use, and no exposure to high-risk viral infections.   Previous bone marrow biopsy was unremarkable per patient's verbal report, when he was 43 years of age.   If platelets fall below 50,000, bleeding risk increases, and below 10,000, spontaneous bleeding can occur. Neutrophils are at a safe level, but if they fall below 500, infection risk increases. Bone marrow biopsy may be reconsidered if counts trend downward.  - Order abdominal ultrasound to assess spleen size.  - Repeat blood counts in one month.  - Call next week to discuss pending test results.  - Avoid aspirin, Aleve, Advil, and ibuprofen to minimize bleeding risk.  - Advise on infection prevention measures, including avoiding sick individuals and practicing good hand hygiene.  Given the chronicity of pancytopenia and asymptomatic status, it does not seem like he has acute bone marrow pathology.  Patient was provided reassurance.

## 2023-07-15 DIAGNOSIS — M7521 Bicipital tendinitis, right shoulder: Secondary | ICD-10-CM | POA: Diagnosis not present

## 2023-07-15 DIAGNOSIS — M25612 Stiffness of left shoulder, not elsewhere classified: Secondary | ICD-10-CM | POA: Diagnosis not present

## 2023-07-15 LAB — HAPTOGLOBIN: Haptoglobin: 175 mg/dL (ref 23–355)

## 2023-07-15 LAB — ANA W/REFLEX IF POSITIVE: Anti Nuclear Antibody (ANA): NEGATIVE

## 2023-07-15 LAB — RHEUMATOID FACTOR: Rheumatoid fact SerPl-aCnc: <10 [IU]/mL (ref <14.0)

## 2023-07-17 DIAGNOSIS — M7521 Bicipital tendinitis, right shoulder: Secondary | ICD-10-CM | POA: Diagnosis not present

## 2023-07-17 DIAGNOSIS — M25612 Stiffness of left shoulder, not elsewhere classified: Secondary | ICD-10-CM | POA: Diagnosis not present

## 2023-07-17 LAB — COPPER, SERUM: Copper: 117 ug/dL (ref 69–132)

## 2023-07-21 ENCOUNTER — Ambulatory Visit (HOSPITAL_COMMUNITY)
Admission: RE | Admit: 2023-07-21 | Discharge: 2023-07-21 | Disposition: A | Discharge status: Home / Self Care | Source: Ambulatory Visit | Attending: Oncology | Admitting: Oncology

## 2023-07-21 ENCOUNTER — Encounter: Payer: Self-pay | Admitting: Oncology

## 2023-07-21 ENCOUNTER — Other Ambulatory Visit: Payer: Self-pay | Admitting: Oncology

## 2023-07-21 ENCOUNTER — Inpatient Hospital Stay: Admitting: Oncology

## 2023-07-21 DIAGNOSIS — D61818 Other pancytopenia: Secondary | ICD-10-CM | POA: Diagnosis not present

## 2023-07-21 DIAGNOSIS — M25612 Stiffness of left shoulder, not elsewhere classified: Secondary | ICD-10-CM | POA: Diagnosis not present

## 2023-07-21 LAB — METHYLMALONIC ACID, SERUM: Methylmalonic Acid, Quantitative: 172 nmol/L (ref 0–378)

## 2023-07-21 NOTE — Assessment & Plan Note (Signed)
 Chronic pancytopenia since age 43 apparently.   On his initial consultation with [place] on 07/14/2023, labs showed evidence of leukopenia (WBC 2,900), anemia (hemoglobin 12.0), and thrombocytopenia (platelets 76,000). Peripheral blood smear showed no immature or concerning cells. Coagulation parameters and fibrinogen were normal.  Reticulocyte count, B12, folate, serum copper, methylmalonic acid, LDH, haptoglobin were normal. Coombs test negative. Rheumatoid factor and ANA were negative.  Testing for alpha thalassemia results are pending.  Recent iron studies and TSH were also within normal limits.  Hemoglobin electrophoresis showed no hemoglobinopathies.   Differential diagnosis includes benign constitutional neutropenia, autoimmune conditions, or hypersplenism. He exhibits no symptoms of recurrent infections, unusual bleeding, or significant weight loss. No significant alcohol or drug use, and no exposure to high-risk viral infections.    Previous bone marrow biopsy was unremarkable per patient's verbal report, when he was 43 years of age.    If platelets fall below 50,000, bleeding risk increases, and below 10,000, spontaneous bleeding can occur. Neutrophils are at a safe level, but if they fall below 500, infection risk increases. Bone marrow biopsy may be reconsidered if counts trend downward.   On 07/21/2023, abdominal ultrasound showed no evidence of hepatomegaly or splenomegaly.    - Avoid aspirin, Aleve, Advil, and ibuprofen to minimize bleeding risk.   - Advised on infection prevention measures, including avoiding sick individuals and practicing good hand hygiene.   Given the chronicity of pancytopenia and asymptomatic status, it does not seem like he has acute bone marrow pathology.  Patient was provided reassurance.

## 2023-07-21 NOTE — Progress Notes (Signed)
 [facility]  HEMATOLOGY-ONCOLOGY ELECTRONIC VISIT PROGRESS NOTE  PATIENT NAME: Preston Simmons   MR#: [id number] DOB: 05-05-1980  DATE OF SERVICE: 07/21/2023  Patient Care Team: Dulce Sellar, NP as PCP - General (Family Medicine)  I connected with the patient via telephone conference and verified that I am speaking with the correct person using two identifiers. The patient's location is at home and I am providing care from the [facility].  I discussed the limitations, risks, security and privacy concerns of performing an evaluation and management service by e-visits and the availability of in person appointments.  I also discussed with the patient that there may be a patient responsible charge related to this service. The patient expressed understanding and agreed to proceed.   ASSESSMENT & PLAN:   Preston Simmons is a 43 y.o. gentleman with a past medical history of GERD, past-obesity, tear of left rotator cuff, was referred to our service in March 2025 for evaluation of chronic pancytopenia, since he was 43 years old at least.  Previously hematological workup was negative, including bone marrow biopsy, at [facility].  Other pancytopenia (HCC) Chronic pancytopenia since age 43 apparently.   On his initial consultation with [place] on 07/14/2023, labs showed evidence of leukopenia (WBC 2,900), anemia (hemoglobin 12.0), and thrombocytopenia (platelets 76,000). Peripheral blood smear showed no immature or concerning cells. Coagulation parameters and fibrinogen were normal.  Reticulocyte count, B12, folate, serum copper, methylmalonic acid, LDH, haptoglobin were normal. Coombs test negative. Rheumatoid factor and ANA were negative.  Testing for alpha thalassemia results are pending.  Recent iron studies and TSH were also within normal limits.  Hemoglobin electrophoresis showed no hemoglobinopathies.   Differential diagnosis includes benign constitutional  neutropenia, autoimmune conditions, or hypersplenism. He exhibits no symptoms of recurrent infections, unusual bleeding, or significant weight loss. No significant alcohol or drug use, and no exposure to high-risk viral infections.    Previous bone marrow biopsy was unremarkable per patient's verbal report, when he was 43 years of age.    If platelets fall below 50,000, bleeding risk increases, and below 10,000, spontaneous bleeding can occur. Neutrophils are at a safe level, but if they fall below 500, infection risk increases. Bone marrow biopsy may be reconsidered if counts trend downward.   On 07/21/2023, abdominal ultrasound showed no evidence of hepatomegaly or splenomegaly.    - Avoid aspirin, Aleve, Advil, and ibuprofen to minimize bleeding risk.   - Advised on infection prevention measures, including avoiding sick individuals and practicing good hand hygiene.   Given the chronicity of pancytopenia and asymptomatic status, it does not seem like he has acute bone marrow pathology.  Patient was provided reassurance.   I discussed the assessment and treatment plan with the patient. The patient was provided an opportunity to ask questions and all were answered. The patient agreed with the plan and demonstrated an understanding of the instructions. The patient was advised to call back or seek an in-person evaluation if the symptoms worsen or if the condition fails to improve as anticipated.    I spent 12 minutes over the phone with the patient reviewing test results, discuss management and coordination/planning of care.  Meryl Crutch, MD 07/21/2023 1:56 PM  CANCER CENTER [facility] - [facility] DE[facility] [address] [place] [address] Dept: [phone] Dept Fax: [phone]   INTERVAL HISTORY:  Please see above for problem oriented charting.  The purpose of today's  discussion is to explain recent lab results and to  formulate plan of care.  He reports feeling generally well with no new symptoms since the last visit. He expresses some anxiety, which he attributes to the wait for test results and the uncertainty of his condition. The patient's recent lab results show stable counts with no signs of an autoimmune condition. He expresses a willingness to continue monitoring his condition as needed and is open to regular check-ups every three months or as advised by the doctor.  SUMMARY OF HEMATOLOGY HISTORY:  On his annual physical visit with his PCP on 06/25/2023, routine labs were obtained.  CBC showed white count of 2600 with ANC of 1000, ALC of 1300, normal differential.  Hemoglobin was 12.3, MCV 71.7.  Platelet count was decreased at 80,000.  CBC was repeated on 07/07/2023.  White count was slightly better but still low at 2900, ANC 934, otherwise normal differential.  Hemoglobin 12.1, MCV 73.2, platelet count 62,000.  Iron studies showed no evidence of iron deficiency.  Hemoglobin electrophoresis showed unremarkable pattern.  Given persistent pancytopenia, referral was sent to [place] for further evaluation.   He has a history of low blood counts since his teenage years. He reports that when he was 43 years old, he had a low blood count and was advised to avoid high contact sports, but no specific diagnosis or treatment was given.  He apparently had extensive workup including bone marrow biopsy at [facility] at that time.    He denies any symptoms of infection such as fevers, chills, or night sweats. He also denies any bleeding or bruising issues. The patient has a history of bicep tendon and rotator cuff surgeries, but he denies any prolonged bleeding or healing issues related to these surgeries. 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 The patient's sister has a history of anemia and low iron.  On his initial consultation with [place] on  07/14/2023, labs showed evidence of leukopenia (WBC 2,900), anemia (hemoglobin 12.0), and thrombocytopenia (platelets 76,000). Peripheral blood smear showed no immature or concerning cells. Coagulation parameters and fibrinogen were normal.  Reticulocyte count, B12, folate, serum copper, methylmalonic acid, LDH, haptoglobin were normal. Coombs test negative. Rheumatoid factor and ANA were negative.  Testing for alpha thalassemia results are pending.  Recent iron studies and TSH were also within normal limits.  Hemoglobin electrophoresis showed no hemoglobinopathies.   Differential diagnosis includes benign constitutional neutropenia, autoimmune conditions, or hypersplenism. He exhibits no symptoms of recurrent infections, unusual bleeding, or significant weight loss. No significant alcohol or drug use, and no exposure to high-risk viral infections.    Previous bone marrow biopsy was unremarkable per patient's verbal report, when he was 43 years of age.    If platelets fall below 50,000, bleeding risk increases, and below 10,000, spontaneous bleeding can occur. Neutrophils are at a safe level, but if they fall below 500, infection risk increases. Bone marrow biopsy may be reconsidered if counts trend downward.   On 07/21/2023, abdominal ultrasound showed no evidence of hepatomegaly or splenomegaly.    - Avoid aspirin, Aleve, Advil, and ibuprofen to minimize bleeding risk.   - Advised on infection prevention measures, including avoiding sick individuals and practicing good hand hygiene.   Given the chronicity of pancytopenia and asymptomatic status, it does not seem like he has acute bone marrow pathology.  Patient was provided reassurance.  REVIEW OF SYSTEMS:  Review of Systems - Oncology  All other pertinent systems were reviewed with the patient and are negative.  I have reviewed the past medical history, past surgical history, social history and family history with the patient and they are  unchanged from previous note.  ALLERGIES:  He is allergic to grass extracts [gramineae pollens].  MEDICATIONS:  Current Outpatient Medications  Medication Sig Dispense Refill   ibuprofen (ADVIL,MOTRIN) 600 MG tablet Take 1 tablet (600 mg total) by mouth every 8 (eight) hours as needed for mild pain. (Patient not taking: Reported on 07/14/2023) 15 tablet 0   mometasone (NASONEX) 50 MCG/ACT nasal spray Apply two sprays each in each nostril once a day as needed for stuffy nose. (Patient not taking: Reported on 07/14/2023) 1 each 5   Olopatadine HCl 0.6 % SOLN 1-2 sprays per nostril twice a day as needed for runny nose/drainage 30.5 g 5   Olopatadine-Mometasone (RYALTRIS) 665-25 MCG/ACT SUSP Place 2 sprays into the nose 2 (two) times daily as needed. (Patient not taking: Reported on 07/14/2023) 29 g 5   pantoprazole (PROTONIX) 20 MG tablet Take 1 tablet (20 mg total) by mouth daily. (Patient not taking: Reported on 07/14/2023) 90 tablet 1   Semaglutide-Weight Management (WEGOVY) 1.7 MG/0.75ML SOAJ Inject 1.7 mg into the skin once a week. (Patient not taking: Reported on 07/14/2023) 3 mL 1   No current facility-administered medications for this visit.    PHYSICAL EXAMINATION:    Onc Performance Status - 07/21/23 1300       ECOG Perf Status   ECOG Perf Status Fully active, able to carry on all pre-disease performance without restriction      KPS SCALE   KPS % SCORE Normal, no compliants, no evidence of disease             LABORATORY DATA:   I have reviewed the data as listed.  Recent Results (from the past 2160 hours)  Lipid panel     Status: Abnormal   Collection Time: 06/25/23  3:03 PM  Result Value Ref Range   Cholesterol 196 0 - 200 mg/dL    Comment: ATP III Classification       Desirable:  < 200 mg/dL               Borderline High:  200 - 239 mg/dL          High:  > = 161 mg/dL   Triglycerides 09.6 0.0 - 149.0 mg/dL    Comment: Normal:  <045 mg/dLBorderline High:  150 - 199  mg/dL   HDL 40.98 >11.91 mg/dL   VLDL 47.8 0.0 - 29.5 mg/dL   LDL Cholesterol 621 (H) 0 - 99 mg/dL   Total CHOL/HDL Ratio 4     Comment:                Men          Women1/2 Average Risk     3.4          3.3Average Risk          5.0          4.42X Average Risk          9.6          7.13X Average Risk          15.0          11.0  NonHDL 146.54     Comment: NOTE:  Non-HDL goal should be 30 mg/dL higher than patient's LDL goal (i.e. LDL goal of < 70 mg/dL, would have non-HDL goal of < 100 mg/dL)  CBC w/Diff     Status: Abnormal   Collection Time: 06/25/23  3:03 PM  Result Value Ref Range   WBC 2.6 (L) 4.0 - 10.5 K/uL   RBC 5.53 4.22 - 5.81 Mil/uL   Hemoglobin 12.3 (L) 13.0 - 17.0 g/dL   HCT 86.5 78.4 - 69.6 %   MCV 71.7 (L) 78.0 - 100.0 fl   MCHC 31.0 30.0 - 36.0 g/dL   RDW 29.5 (H) 28.4 - 13.2 %   Platelets 80.0 Repeated and verified X2. (L) 150.0 - 400.0 K/uL   Neutrophils Relative % 37.1 (L) 43.0 - 77.0 %   Lymphocytes Relative 51.1 (H) 12.0 - 46.0 %   Monocytes Relative 10.1 3.0 - 12.0 %   Eosinophils Relative 1.0 0.0 - 5.0 %   Basophils Relative 0.7 0.0 - 3.0 %   Neutro Abs 1.0 (L) 1.4 - 7.7 K/uL   Lymphs Abs 1.3 0.7 - 4.0 K/uL   Monocytes Absolute 0.3 0.1 - 1.0 K/uL   Eosinophils Absolute 0.0 0.0 - 0.7 K/uL   Basophils Absolute 0.0 0.0 - 0.1 K/uL  Comp Met (CMET)     Status: None   Collection Time: 06/25/23  3:03 PM  Result Value Ref Range   Sodium 140 135 - 145 mEq/L   Potassium 4.2 3.5 - 5.1 mEq/L   Chloride 105 96 - 112 mEq/L   CO2 28 19 - 32 mEq/L   Glucose, Bld 87 70 - 99 mg/dL   BUN 16 6 - 23 mg/dL   Creatinine, Ser 4.40 0.40 - 1.50 mg/dL   Total Bilirubin 0.3 0.2 - 1.2 mg/dL   Alkaline Phosphatase 52 39 - 117 U/L   AST 28 0 - 37 U/L   ALT 23 0 - 53 U/L   Total Protein 7.7 6.0 - 8.3 g/dL   Albumin 4.5 3.5 - 5.2 g/dL   GFR 10.27 >25.36 mL/min    Comment: Calculated using the CKD-EPI Creatinine Equation (2021)   Calcium 9.6 8.4 - 10.5 mg/dL   TSH     Status: None   Collection Time: 06/25/23  3:03 PM  Result Value Ref Range   TSH 1.95 0.35 - 5.50 uIU/mL  Hemoglobinopathy Evaluation     Status: Abnormal   Collection Time: 07/07/23  2:23 PM  Result Value Ref Range   RBC 5.41 4.20 - 5.80 Million/uL   Hemoglobin 12.1 (L) 13.2 - 17.1 g/dL   HCT 64.4 03.4 - 74.2 %   MCV 73.2 (L) 80.0 - 100.0 fL   MCH 22.4 (L) 27.0 - 33.0 pg   RDW 16.7 (H) 11.0 - 15.0 %   Hgb A 97.8 >96.0 %   Fetal Hemoglobin Testing <1.0 0.0 - 1.9 %   Hemoglobin A2 - HGBRFX 2.2 2.0 - 3.2 %   Interpretation      Comment: The hemoglobin pattern is normal. No hemoglobin variant is identified. Alpha thalassemia is possible if iron deficiency and other causes of low MCV or MCH are ruled out. --- Royal Hawthorn MD PhD, Medical Director   CBC with Differential/Platelet     Status: Abnormal   Collection Time: 07/07/23  2:23 PM  Result Value Ref Range   WBC 2.9 (L) 3.8 - 10.8 Thousand/uL   RBC 5.27 4.20 - 5.80 Million/uL  Hemoglobin 11.9 (L) 13.2 - 17.1 g/dL   HCT 09.8 (L) 11.9 - 14.7 %   MCV 71.3 (L) 80.0 - 100.0 fL   MCH 22.6 (L) 27.0 - 33.0 pg   MCHC 31.6 (L) 32.0 - 36.0 g/dL    Comment: For adults, a slight decrease in the calculated MCHC value (in the range of 30 to 32 g/dL) is most likely not clinically significant; however, it should be interpreted with caution in correlation with other red cell parameters and the patient's clinical condition.    RDW 16.3 (H) 11.0 - 15.0 %   Platelets 62 (L) 140 - 400 Thousand/uL    Comment: Review of the peripheral smear reveals decreased numbers of platelets.    MPV  7.5 - 12.5 fL    Comment: Due to platelet or RBC variability in size or shape the result cannot be reported accurately.    Neutro Abs 934 (L) 1,500 - 7,800 cells/uL   Absolute Lymphocytes 1,615 850 - 3,900 cells/uL   Absolute Monocytes 313 200 - 950 cells/uL   Eosinophils Absolute 29 15 - 500 cells/uL   Basophils Absolute 9 0 - 200 cells/uL    Neutrophils Relative % 32.2 %   Total Lymphocyte 55.7 %   Monocytes Relative 10.8 %   Eosinophils Relative 1.0 %   Basophils Relative 0.3 %  Iron, TIBC and Ferritin Panel     Status: None   Collection Time: 07/07/23  2:23 PM  Result Value Ref Range   Iron 74 50 - 180 mcg/dL   TIBC 829 562 - 130 mcg/dL (calc)   %SAT 29 20 - 48 % (calc)   Ferritin 153 38 - 380 ng/mL  Rheumatoid factor     Status: None   Collection Time: 07/14/23 10:44 AM  Result Value Ref Range   Rheumatoid fact SerPl-aCnc <10.0 <14.0 IU/mL    Comment: (NOTE) Performed At: [facility] [address] [place], [place] [id number] Jolene Schimke MD EX:[id number]   Fibrinogen     Status: None   Collection Time: 07/14/23 10:44 AM  Result Value Ref Range   Fibrinogen 362 210 - 475 mg/dL    Comment: (NOTE) Fibrinogen results may be underestimated in patients receiving thrombolytic therapy. Performed at [facility], [address], [place], [place] [address]   APTT     Status: Abnormal   Collection Time: 07/14/23 10:44 AM  Result Value Ref Range   aPTT 23 (L) 24 - 36 seconds    Comment: Performed at [facility], [address] [place], [place] [address]  Protime-INR     Status: None   Collection Time: 07/14/23 10:44 AM  Result Value Ref Range   Prothrombin Time 11.8 11.4 - 15.2 seconds   INR 0.9 0.8 - 1.2    Comment: (NOTE) INR goal varies based on device and disease states. Performed at [facility], [address] [place], [place] [address]   Save Smear for Provider Slide Review     Status: None   Collection Time: 07/14/23 10:44 AM  Result Value Ref Range   Smear Review SMEAR STAINED AND AVAILABLE FOR REVIEW     Comment: Performed at [facility], [address], [place], [place] [address]  Copper, serum     Status: None   Collection Time: 07/14/23 10:44 AM  Result Value Ref Range   Copper 117 69 - 132 ug/dL    Comment:  (NOTE) This test was developed and its performance characteristics  determined by [facility]. It has not been cleared or approved by the Food and Drug Administration.                                Detection Limit = 5 Performed At: [facility] [address] [place], [place] [id number] Jolene Schimke MD [id number]   Haptoglobin     Status: None   Collection Time: 07/14/23 10:44 AM  Result Value Ref Range   Haptoglobin 175 23 - 355 mg/dL    Comment: (NOTE) Performed At: [facility] [address] [place], [place] [id number] Jolene Schimke MD [id number]   Vitamin B12     Status: None   Collection Time: 07/14/23 10:44 AM  Result Value Ref Range   Vitamin B-12 358 180 - 914 pg/mL    Comment: (NOTE) This assay is not validated for testing neonatal or myeloproliferative syndrome specimens for Vitamin B12 levels. Performed at [facility], [address], [place], [place] [address]   Lactate dehydrogenase     Status: None   Collection Time: 07/14/23 10:44 AM  Result Value Ref Range   LDH 159 98 - 192 U/L    Comment: Performed at [facility], [address] [place], [place] [address]  CMP (Cancer Center only)     Status: None   Collection Time: 07/14/23 10:44 AM  Result Value Ref Range   Sodium 141 135 - 145 mmol/L   Potassium 3.9 3.5 - 5.1 mmol/L   Chloride 104 98 - 111 mmol/L   CO2 28 22 - 32 mmol/L   Glucose, Bld 90 70 - 99 mg/dL    Comment: Glucose reference range applies only to samples taken after fasting for at least 8 hours.   BUN 18 6 - 20 mg/dL   Creatinine 3.47 4.25 - 1.24 mg/dL   Calcium 9.4 8.9 - 95.6 mg/dL   Total Protein 7.5 6.5 - 8.1 g/dL   Albumin 4.6 3.5 - 5.0 g/dL   AST 21 15 - 41 U/L   ALT 20 0 - 44 U/L   Alkaline Phosphatase 56 38 - 126 U/L   Total Bilirubin 0.3 0.0 - 1.2 mg/dL   GFR, Estimated >38 >75 mL/min    Comment: (NOTE) Calculated using the CKD-EPI Creatinine Equation (2021)    Anion gap 9 5 -  15    Comment: Performed at [facility], [address], [place], [place] [address]  CBC with Differential (Cancer Center Only)     Status: Abnormal   Collection Time: 07/14/23 10:44 AM  Result Value Ref Range   WBC Count 2.9 (L) 4.0 - 10.5 K/uL   RBC 5.34 4.22 - 5.81 MIL/uL   Hemoglobin 12.0 (L) 13.0 - 17.0 g/dL   HCT 95.1 88.4 - 16.6 %   MCV 73.0 (L) 80.0 - 100.0 fL   MCH 22.5 (L) 26.0 - 34.0 pg   MCHC 30.8 30.0 - 36.0 g/dL   RDW 06.3 (H) 01.6 - 01.0 %   Platelet Count 76 (L) 150 - 400 K/uL   nRBC 0.0 0.0 - 0.2 %   Neutrophils Relative % 34 %   Neutro Abs 1.0 (L) 1.7 - 7.7 K/uL   Lymphocytes Relative 55 %   Lymphs Abs 1.6 0.7 - 4.0 K/uL   Monocytes Relative 10 %   Monocytes Absolute 0.3 0.1 - 1.0 K/uL   Eosinophils Relative 1 %   Eosinophils Absolute 0.0  0.0 - 0.5 K/uL   Basophils Relative 0 %   Basophils Absolute 0.0 0.0 - 0.1 K/uL   Immature Granulocytes 0 %   Abs Immature Granulocytes 0.01 0.00 - 0.07 K/uL    Comment: Performed at [facility], [address], [place], [place] [address]  Direct antiglobulin test (not at [facility])     Status: None   Collection Time: 07/14/23 10:45 AM  Result Value Ref Range   DAT, complement NEG    DAT, IgG      NEG Performed at [facility], [address], [place], [place] [address]   Reticulocytes     Status: None   Collection Time: 07/14/23 10:45 AM  Result Value Ref Range   Retic Ct Pct 0.8 0.4 - 3.1 %   RBC. 5.24 4.22 - 5.81 MIL/uL   Retic Count, Absolute 39.8 19.0 - 186.0 K/uL   Immature Retic Fract 13.5 2.3 - 15.9 %    Comment: Performed at [facility], [address], [place], [place] [address]  ANA w/Reflex if Positive     Status: None   Collection Time: 07/14/23 10:45 AM  Result Value Ref Range   Anti Nuclear Antibody (ANA) Negative Negative    Comment: (NOTE) Performed At: [facility] [address] [place], [place]  [id number] Jolene Schimke MD QI:[id number]   Folate     Status: None   Collection Time: 07/14/23 10:45 AM  Result Value Ref Range   Folate 9.2 >5.9 ng/mL    Comment: Performed at [facility], [address], [place], [place] [address]     RADIOGRAPHIC STUDIES:  I have personally reviewed the radiological images as listed and agree with the findings in the report.  US Abdomen Complete Result Date: 07/21/2023 CLINICAL DATA:  Pancytopenia. Evaluate for liver or spleen abnormality. EXAM: ABDOMEN ULTRASOUND COMPLETE COMPARISON:  None Available. FINDINGS: Gallbladder: No gallstones or wall thickening visualized. No sonographic Murphy sign noted by sonographer. Common bile duct: Ultrasound technologist reports not visualized. Liver: No focal lesion identified. Within normal limits in parenchymal echogenicity. Portal vein is patent on color Doppler imaging with normal direction of blood flow towards the liver. IVC: No abnormality visualized. Pancreas: Visualized portion unremarkable. Spleen: Size and appearance within normal limits. Spleen measures 10 cm. Right Kidney: Length: 10 cm. Echogenicity within normal limits. No mass or hydronephrosis visualized. Left Kidney: Length: 7.3 cm. Echogenicity within normal limits. No mass or hydronephrosis visualized. Abdominal aorta: No aneurysm visualized. Other findings: None. IMPRESSION: No acute abnormality identified. No splenomegaly. Electronically Signed   By: Sherian Rein M.D.   On: 07/21/2023 08:43     Orders Placed This Encounter  Procedures   CBC with Differential (Cancer Center Only)    Standing Status:   Future    Expected Date:   08/11/2023    Expiration Date:   07/20/2024     Future Appointments  Date Time Provider Department Center  08/11/2023  2:30 PM [facility] PHLEBOTOMIST [facility] None  08/11/2023  3:00 PM Dushaun Okey, Archie Patten, MD [facility] None  02/12/2024  4:00 PM Alfonse Spruce, MD [facility] None  06/30/2024  2:30 PM Dulce Sellar, NP [facility]    This document was completed utilizing speech recognition software. Grammatical errors, random word insertions, pronoun errors, and incomplete sentences are an occasional consequence of this system due to software limitations, ambient noise, and hardware issues. Any formal questions or concerns about the content, text or information contained within the body of  this dictation should be directly addressed to the provider for clarification.

## 2023-07-22 LAB — ALPHA-THALASSEMIA GENOTYPR

## 2023-07-23 DIAGNOSIS — M25612 Stiffness of left shoulder, not elsewhere classified: Secondary | ICD-10-CM | POA: Diagnosis not present

## 2023-07-28 DIAGNOSIS — M25612 Stiffness of left shoulder, not elsewhere classified: Secondary | ICD-10-CM | POA: Diagnosis not present

## 2023-07-30 DIAGNOSIS — M25612 Stiffness of left shoulder, not elsewhere classified: Secondary | ICD-10-CM | POA: Diagnosis not present

## 2023-08-11 ENCOUNTER — Inpatient Hospital Stay: Attending: Oncology | Admitting: Oncology

## 2023-08-11 ENCOUNTER — Inpatient Hospital Stay

## 2023-08-11 DIAGNOSIS — D649 Anemia, unspecified: Secondary | ICD-10-CM | POA: Insufficient documentation

## 2023-08-11 DIAGNOSIS — D61818 Other pancytopenia: Secondary | ICD-10-CM | POA: Diagnosis not present

## 2023-08-11 DIAGNOSIS — D696 Thrombocytopenia, unspecified: Secondary | ICD-10-CM | POA: Diagnosis not present

## 2023-08-11 DIAGNOSIS — D563 Thalassemia minor: Secondary | ICD-10-CM | POA: Diagnosis not present

## 2023-08-11 LAB — CBC WITH DIFFERENTIAL (CANCER CENTER ONLY)
Abs Immature Granulocytes: 0.01 10*3/uL (ref 0.00–0.07)
Basophils Absolute: 0 10*3/uL (ref 0.0–0.1)
Basophils Relative: 0 %
Eosinophils Absolute: 0 10*3/uL (ref 0.0–0.5)
Eosinophils Relative: 1 %
HCT: 37.5 % — ABNORMAL LOW (ref 39.0–52.0)
Hemoglobin: 11.7 g/dL — ABNORMAL LOW (ref 13.0–17.0)
Immature Granulocytes: 0 %
Lymphocytes Relative: 47 %
Lymphs Abs: 1.6 10*3/uL (ref 0.7–4.0)
MCH: 22.7 pg — ABNORMAL LOW (ref 26.0–34.0)
MCHC: 31.2 g/dL (ref 30.0–36.0)
MCV: 72.7 fL — ABNORMAL LOW (ref 80.0–100.0)
Monocytes Absolute: 0.4 10*3/uL (ref 0.1–1.0)
Monocytes Relative: 11 %
Neutro Abs: 1.4 10*3/uL — ABNORMAL LOW (ref 1.7–7.7)
Neutrophils Relative %: 41 %
Platelet Count: 73 10*3/uL — ABNORMAL LOW (ref 150–400)
RBC: 5.16 MIL/uL (ref 4.22–5.81)
RDW: 16.3 % — ABNORMAL HIGH (ref 11.5–15.5)
WBC Count: 3.4 10*3/uL — ABNORMAL LOW (ref 4.0–10.5)
nRBC: 0 % (ref 0.0–0.2)

## 2023-08-11 NOTE — Assessment & Plan Note (Signed)
 Chronic pancytopenia since age 43 apparently.   On his initial consultation with [place] on 07/14/2023, labs showed evidence of leukopenia (WBC 2,900), anemia (hemoglobin 12.0), and thrombocytopenia (platelets 76,000). Peripheral blood smear showed no immature or concerning cells. Coagulation parameters and fibrinogen were normal.  Reticulocyte count, B12, folate, serum copper, methylmalonic acid, LDH, haptoglobin were normal. Coombs test negative. Rheumatoid factor and ANA were negative.  Testing for alpha thalassemia results are pending.  Recent iron studies and TSH were also within normal limits.  Hemoglobin electrophoresis showed no hemoglobinopathies.   Differential diagnosis includes benign constitutional neutropenia, autoimmune conditions, or hypersplenism. He exhibits no symptoms of recurrent infections, unusual bleeding, or significant weight loss. No significant alcohol or drug use, and no exposure to high-risk viral infections.    Previous bone marrow biopsy was unremarkable per patient's verbal report, when he was 43 years of age.    If platelets fall below 50,000, bleeding risk increases, and below 10,000, spontaneous bleeding can occur. Neutrophils are at a safe level, but if they fall below 500, infection risk increases. Bone marrow biopsy may be reconsidered if counts trend downward.   On 07/21/2023, abdominal ultrasound showed no evidence of hepatomegaly or splenomegaly.    - Avoid aspirin, Aleve, Advil, and ibuprofen to minimize bleeding risk.   - Advised on infection prevention measures, including avoiding sick individuals and practicing good hand hygiene.   Given the chronicity of pancytopenia and asymptomatic status, it does not seem like he has acute bone marrow pathology.  Patient was provided reassurance.

## 2023-08-11 NOTE — Progress Notes (Unsigned)
 [facility]  HEMATOLOGY CLINIC PROGRESS NOTE  PATIENT NAME: [facility]   MR#: [id number] DOB: 04-07-1981  Patient Care Team: Preston Gore, NP as PCP - General (Family Medicine)  Date of visit: 08/11/2023   ASSESSMENT & PLAN:   [facility] is a 43 y.o. pleasant gentleman with a past medical history of GERD, past-obesity, tear of left rotator cuff, was referred to our service in March 2025 for evaluation of chronic pancytopenia, since he was 43 years old at least.  Previously hematological workup was negative, including bone marrow biopsy, at [facility].   Other pancytopenia (HCC) Chronic pancytopenia since age 43 apparently.   On his initial consultation with us  on 07/14/2023, labs showed evidence of leukopenia (WBC 2,900), anemia (hemoglobin 12.0), and thrombocytopenia (platelets 76,000). Peripheral blood smear showed no immature or concerning cells. Coagulation parameters and fibrinogen  were normal.  Reticulocyte count, B12, folate, serum copper , methylmalonic acid, LDH, haptoglobin were normal. Coombs test negative. Rheumatoid factor and ANA were negative. 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   Recent iron studies and TSH were also within normal limits.  Hemoglobin electrophoresis showed no hemoglobinopathies.   Differential diagnosis includes benign constitutional neutropenia, autoimmune conditions, or hypersplenism. He exhibits no symptoms of recurrent infections, unusual bleeding, or significant weight loss. No significant alcohol or drug use, and no exposure to high-risk viral infections.    Previous bone marrow biopsy was unremarkable per patient's verbal report, when he was 43 years of age.    If platelets fall below 50,000, bleeding risk increases, and below 10,000, spontaneous bleeding can occur. Neutrophils are at a safe level, but if they fall below 500, infection risk  increases. Bone marrow biopsy may be reconsidered if counts trend downward.   On 07/21/2023, abdominal ultrasound showed no evidence of hepatomegaly or splenomegaly.  Repeat labs today showed improved white count of 3400, ANC 1400, normal differential.  Hemoglobin stable at 11.7, MCV 72.7.  Platelet count stable at 73,000.    - Avoid aspirin, Aleve, Advil , and ibuprofen  to minimize bleeding risk.   - Advised on infection prevention measures, including avoiding sick individuals and practicing good hand hygiene.   Given the chronicity of pancytopenia and asymptomatic status, it does not seem like he has acute bone marrow pathology.  Patient was provided reassurance.  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 This hereditary condition is mild and typically asymptomatic, with a slight risk of nephrolithiasis. It does not account for leukopenia or thrombocytopenia. No treatment is required unless hemoglobin drops below 8 g/dL, warranting consideration of a blood transfusion. The condition has a 25% chance of being passed to offspring, remaining a carrier state without significant symptoms. It is more prevalent in individuals of [place] or Asian descent. - Ensure adequate hydration to reduce risk of nephrolithiasis - Monitor hemoglobin levels; consider blood transfusion if hemoglobin drops below 8 g/dL   I spent a total of 25 minutes during this encounter with the patient including review of chart and various tests results, discussions about plan of care and coordination of care plan.  I reviewed lab results and outside records for this visit and discussed relevant results with the patient. Diagnosis, plan of care and treatment options were also discussed in detail with the patient. Opportunity provided to ask questions and answers provided to his apparent satisfaction. Provided instructions to call our clinic with  any  problems, questions or concerns prior to return visit. I recommended to continue follow-up with PCP and sub-specialists. He verbalized understanding and agreed with the plan. No barriers to learning was detected.  Preston Berber, MD  08/11/2023 3:56 PM  [facility] - A DEPT OF [facility] [id number] [place] [address] Dept: [phone] Dept Fax: [phone]   CHIEF COMPLAINT/ REASON FOR VISIT:  Follow-up for chronic pancytopenia of undetermined etiology.  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  INTERVAL HISTORY:  Discussed the use of AI scribe software for clinical note transcription with the patient, who gave verbal consent to proceed.  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  Recent lab results show improvement in blood counts, with a white blood cell count of 3,400, up from 2,900, stable hemoglobin at 11.7, and an increased platelet count of 73,000 from 62,000.  He has alpha thalassemia minor, characterized by mild anemia and microcytosis. Iron deficiency has been ruled out as a cause of his anemia. A bone marrow biopsy performed several years ago showed no concerning findings, and no further biopsy is necessary at this time.  He denies any fevers, chills, night sweats, unusual bleeding, or bruising. An abdominal ultrasound showed normal spleen and liver sizes, ruling out splenomegaly as a cause for his low blood counts.  He maintains a good appetite and stable weight, with a recent five-pound gain during a vacation to [facility] with his family, where he engaged in outdoor activities and walked frequently.   SUMMARY OF HEMATOLOGIC HISTORY:  On his annual physical visit with his PCP on 06/25/2023, routine labs were obtained.  CBC showed white count of 2600 with ANC of 1000, ALC of  1300, normal differential.  Hemoglobin was 12.3, MCV 71.7.  Platelet count was decreased at 80,000.  CBC was repeated on 07/07/2023.  White count was slightly better but still low at 2900, ANC 934, otherwise normal differential.  Hemoglobin 12.1, MCV 73.2, platelet count 62,000.  Iron studies showed no evidence of iron deficiency.  Hemoglobin electrophoresis showed unremarkable pattern.  Given persistent pancytopenia, referral was sent to us  for further evaluation.   He has a history of low blood counts since his teenage years. He reports that when he was 43 years old, he had a low blood count and was advised to avoid high contact sports, but no specific diagnosis or treatment was given.  He apparently had extensive workup including bone marrow biopsy at [facility] at that time.    He denies any symptoms of infection such as fevers, chills, or night sweats. He also denies any bleeding or bruising issues. The patient has a history of bicep tendon and rotator cuff surgeries, but he denies any prolonged bleeding or healing issues related to these surgeries. 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 The patient's sister has a history of anemia and low iron.   On his initial consultation with us  on 07/14/2023, labs showed evidence of leukopenia (WBC 2,900), anemia (hemoglobin 12.0), and thrombocytopenia (platelets 76,000). Peripheral blood smear showed no immature or concerning cells. Coagulation parameters and fibrinogen  were normal.  Reticulocyte count, B12, folate, serum copper , methylmalonic acid, LDH, haptoglobin were normal. Coombs test negative. Rheumatoid factor and ANA were negative.  Alpha thalassemia genotype  testing did show evidence of alpha thalassemia minor, alpha-/alpha- mutations.   Recent iron studies and TSH were also within normal limits.  Hemoglobin electrophoresis showed no hemoglobinopathies.    Differential diagnosis includes benign constitutional neutropenia, autoimmune conditions, or hypersplenism. He exhibits no symptoms of recurrent infections, unusual bleeding, or significant weight loss. No significant alcohol or drug use, and no exposure to high-risk viral infections.    Previous bone marrow biopsy was unremarkable per patient's verbal report, when he was 43 years of age.    If platelets fall below 50,000, bleeding risk increases, and below 10,000, spontaneous bleeding can occur. Neutrophils are at a safe level, but if they fall below 500, infection risk increases. Bone marrow biopsy may be reconsidered if counts trend downward.   On 07/21/2023, abdominal ultrasound showed no evidence of hepatomegaly or splenomegaly.    - Avoid aspirin, Aleve, Advil , and ibuprofen  to minimize bleeding risk.   - Advised on infection prevention measures, including avoiding sick individuals and practicing good hand hygiene.   Given the chronicity of pancytopenia and asymptomatic status, it does not seem like he has acute bone marrow pathology.  Patient was provided reassurance.  I have reviewed the past medical history, past surgical history, social history and family history with the patient and they are unchanged from previous note.  ALLERGIES: He is allergic to grass extracts [gramineae pollens].  MEDICATIONS:  Current Outpatient Medications  Medication Sig Dispense Refill   Olopatadine -Mometasone  (RYALTRIS ) 665-25 MCG/ACT SUSP Place 2 sprays into the nose 2 (two) times daily as needed. 29 g 5   ibuprofen  (ADVIL ,MOTRIN ) 600 MG tablet Take 1 tablet (600 mg total) by mouth every 8 (eight) hours as needed for mild pain. (Patient not taking: Reported on 08/11/2023) 15 tablet 0   mometasone  (NASONEX ) 50 MCG/ACT nasal spray Apply two sprays each in each nostril once a day as needed for stuffy nose. (Patient not taking: Reported on 06/25/2023) 1 each 5   Olopatadine  HCl 0.6 % SOLN 1-2 sprays per  nostril twice a day as needed for runny nose/drainage (Patient not taking: Reported on 08/11/2023) 30.5 g 5   pantoprazole  (PROTONIX ) 20 MG tablet Take 1 tablet (20 mg total) by mouth daily. (Patient not taking: Reported on 08/11/2023) 90 tablet 1   Semaglutide -Weight Management (WEGOVY ) 1.7 MG/0.75ML SOAJ Inject 1.7 mg into the skin once a week. (Patient not taking: Reported on 08/11/2023) 3 mL 1   No current facility-administered medications for this visit.     REVIEW OF SYSTEMS:    Review of Systems - Oncology  All other pertinent systems were reviewed with the patient and are negative.  PHYSICAL EXAMINATION:   Onc Performance Status - 08/11/23 1528       ECOG Perf Status   ECOG Perf Status Fully active, able to carry on all pre-disease performance without restriction      KPS SCALE   KPS % SCORE Normal, no compliants, no evidence of disease             There were no vitals filed for this visit. There were no vitals filed for this visit.  Physical Exam Constitutional:      General: He is not in acute distress.    Appearance: Normal appearance.  HENT:     Head: Normocephalic and atraumatic.  Eyes:     General: No scleral icterus.    Conjunctiva/sclera: Conjunctivae normal.  Cardiovascular:     Rate and Rhythm: Normal rate and regular rhythm.  Heart sounds: Normal heart sounds.  Pulmonary:     Effort: Pulmonary effort is normal.     Breath sounds: Normal breath sounds.  Abdominal:     General: There is no distension.  Musculoskeletal:     Right lower leg: No edema.     Left lower leg: No edema.  Neurological:     General: No focal deficit present.     Mental Status: He is alert and oriented to person, place, and time.  Psychiatric:        Mood and Affect: Mood normal.        Behavior: Behavior normal.        Thought Content: Thought content normal.      LABORATORY DATA:   I have reviewed the data as listed.  Results for orders placed or performed  in visit on 08/11/23  CBC with Differential (Cancer Center Only)  Result Value Ref Range   WBC Count 3.4 (L) 4.0 - 10.5 K/uL   RBC 5.16 4.22 - 5.81 MIL/uL   Hemoglobin 11.7 (L) 13.0 - 17.0 g/dL   HCT 16.1 (L) 09.6 - 04.5 %   MCV 72.7 (L) 80.0 - 100.0 fL   MCH 22.7 (L) 26.0 - 34.0 pg   MCHC 31.2 30.0 - 36.0 g/dL   RDW 40.9 (H) 81.1 - 91.4 %   Platelet Count 73 (L) 150 - 400 K/uL   nRBC 0.0 0.0 - 0.2 %   Neutrophils Relative % 41 %   Neutro Abs 1.4 (L) 1.7 - 7.7 K/uL   Lymphocytes Relative 47 %   Lymphs Abs 1.6 0.7 - 4.0 K/uL   Monocytes Relative 11 %   Monocytes Absolute 0.4 0.1 - 1.0 K/uL   Eosinophils Relative 1 %   Eosinophils Absolute 0.0 0.0 - 0.5 K/uL   Basophils Relative 0 %   Basophils Absolute 0.0 0.0 - 0.1 K/uL   Immature Granulocytes 0 %   Abs Immature Granulocytes 0.01 0.00 - 0.07 K/uL    RADIOGRAPHIC STUDIES:  I have personally reviewed the radiological images as listed and agree with the findings in the report.  US  Abdomen Complete Result Date: 07/21/2023 CLINICAL DATA:  Pancytopenia. Evaluate for liver or spleen abnormality. EXAM: ABDOMEN ULTRASOUND COMPLETE COMPARISON:  None Available. FINDINGS: Gallbladder: No gallstones or wall thickening visualized. No sonographic Murphy sign noted by sonographer. Common bile duct: Ultrasound technologist reports not visualized. Liver: No focal lesion identified. Within normal limits in parenchymal echogenicity. Portal vein is patent on color Doppler imaging with normal direction of blood flow towards the liver. IVC: No abnormality visualized. Pancreas: Visualized portion unremarkable. Spleen: Size and appearance within normal limits. Spleen measures 10 cm. Right Kidney: Length: 10 cm. Echogenicity within normal limits. No mass or hydronephrosis visualized. Left Kidney: Length: 7.3 cm. Echogenicity within normal limits. No mass or hydronephrosis visualized. Abdominal aorta: No aneurysm visualized. Other findings: None. IMPRESSION: No  acute abnormality identified. No splenomegaly. Electronically Signed   By: Anna Barnes M.D.   On: 07/21/2023 08:43    No orders of the defined types were placed in this encounter.    Future Appointments  Date Time Provider Department Center  02/12/2024  4:00 PM Rochester Chuck, MD [facility] None  06/30/2024  2:30 PM Preston Gore, NP [facility]     This document was completed utilizing speech recognition software. Grammatical errors, random word insertions, pronoun errors, and incomplete sentences are an occasional consequence of this system due to software limitations, ambient noise, and hardware  issues. Any formal questions or concerns about the content, text or information contained within the body of this dictation should be directly addressed to the provider for clarification.

## 2023-08-12 ENCOUNTER — Encounter: Payer: Self-pay | Admitting: Oncology

## 2023-08-12 NOTE — Assessment & Plan Note (Signed)
 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 This hereditary condition is mild and typically asymptomatic, with a slight risk of nephrolithiasis. It does not account for leukopenia or thrombocytopenia. No treatment is required unless hemoglobin drops below 8 g/dL, warranting consideration of a blood transfusion. The condition has a 25% chance of being passed to offspring, remaining a carrier state without significant symptoms. It is more prevalent in individuals of [place] or Asian descent. - Ensure adequate hydration to reduce risk of nephrolithiasis - Monitor hemoglobin levels; consider blood transfusion if hemoglobin drops below 8 g/dL

## 2023-08-13 ENCOUNTER — Telehealth: Payer: Self-pay | Admitting: Oncology

## 2023-08-13 NOTE — Telephone Encounter (Signed)
 Patient has been scheduled for follow-up visit per 08/11/23 LOS.  Pt aware of scheduled appt details.

## 2023-08-14 DIAGNOSIS — M25612 Stiffness of left shoulder, not elsewhere classified: Secondary | ICD-10-CM | POA: Diagnosis not present

## 2023-08-25 DIAGNOSIS — J0100 Acute maxillary sinusitis, unspecified: Secondary | ICD-10-CM | POA: Diagnosis not present

## 2023-08-25 DIAGNOSIS — J3089 Other allergic rhinitis: Secondary | ICD-10-CM | POA: Diagnosis not present

## 2023-08-25 DIAGNOSIS — J302 Other seasonal allergic rhinitis: Secondary | ICD-10-CM | POA: Diagnosis not present

## 2023-09-17 DIAGNOSIS — Z4789 Encounter for other orthopedic aftercare: Secondary | ICD-10-CM | POA: Diagnosis not present

## 2023-12-09 ENCOUNTER — Inpatient Hospital Stay

## 2023-12-09 ENCOUNTER — Inpatient Hospital Stay: Admitting: Oncology

## 2024-02-12 ENCOUNTER — Ambulatory Visit: Payer: BC Managed Care – PPO | Admitting: Allergy & Immunology

## 2024-02-12 DIAGNOSIS — J309 Allergic rhinitis, unspecified: Secondary | ICD-10-CM

## 2024-06-30 ENCOUNTER — Encounter: Admitting: Family
